# Patient Record
Sex: Female | Born: 1990 | Race: Black or African American | Hispanic: No | Smoking: Former smoker
Health system: Southern US, Community
[De-identification: ages and names within clinical notes are randomized; demographics above are authoritative.]

## PROBLEM LIST (undated history)

## (undated) DIAGNOSIS — J45909 Unspecified asthma, uncomplicated: Secondary | ICD-10-CM

## (undated) HISTORY — PX: DILATION AND CURETTAGE OF UTERUS: SHX78

## (undated) HISTORY — PX: BREAST LUMPECTOMY: SHX2

## (undated) HISTORY — DX: Unspecified asthma, uncomplicated: J45.909

## (undated) HISTORY — PX: TONSILLECTOMY: SUR1361

---

## 2020-09-15 ENCOUNTER — Ambulatory Visit (INDEPENDENT_AMBULATORY_CARE_PROVIDER_SITE_OTHER): Payer: Medicaid Other | Admitting: Obstetrics and Gynecology

## 2020-09-15 ENCOUNTER — Other Ambulatory Visit: Payer: Self-pay

## 2020-09-15 DIAGNOSIS — Z3A09 9 weeks gestation of pregnancy: Secondary | ICD-10-CM | POA: Insufficient documentation

## 2020-09-15 DIAGNOSIS — Z3201 Encounter for pregnancy test, result positive: Secondary | ICD-10-CM | POA: Diagnosis not present

## 2020-09-15 LAB — POCT PREGNANCY, URINE: Preg Test, Ur: POSITIVE — AB

## 2020-09-15 NOTE — Patient Instructions (Signed)
Prenatal Care Providers           [facility]  [address] [phone]  [facility]   [address]  [phone]  [facility] @ [place]       [address] [phone]            [facility] @ [place]     [address] [phone]          [facility] @ [place]   [address] [phone]  [facility]  [address] [phone]     [facility])  [address]   [phone]     [facility]  Phone: [phone]  [facility]  Phone: [phone]  [facility] Phone: [phone]  [facility] Phone: [phone]  [facility] Phone: [phone]  [facility] Phone: [phone]  [facility]  Phone: [phone]  

## 2020-09-15 NOTE — Progress Notes (Signed)
Here for nurse visit for pregnancy test which was positive. Her LMP 07/10/20 this makes her [redacted]w[redacted]d with EDD 04/16/21. She states she had high risk with previous child due to short cervix. She has not been seen before in our practice Dr. Donavan Foil in to meet [facility]. Advised to start prenatal care asap and to start prenatal vitamins. List of providers placed in AVS. Brandi Penley,RN

## 2020-10-09 ENCOUNTER — Encounter: Payer: Self-pay | Admitting: Obstetrics and Gynecology

## 2020-10-09 ENCOUNTER — Other Ambulatory Visit (HOSPITAL_COMMUNITY)
Admission: RE | Admit: 2020-10-09 | Discharge: 2020-10-09 | Disposition: A | Discharge status: Home / Self Care | Payer: Medicaid Other | Source: Ambulatory Visit | Attending: Obstetrics and Gynecology | Admitting: Obstetrics and Gynecology

## 2020-10-09 ENCOUNTER — Other Ambulatory Visit: Payer: Self-pay

## 2020-10-09 ENCOUNTER — Ambulatory Visit (INDEPENDENT_AMBULATORY_CARE_PROVIDER_SITE_OTHER): Payer: Medicaid Other | Admitting: Obstetrics and Gynecology

## 2020-10-09 DIAGNOSIS — O09291 Supervision of pregnancy with other poor reproductive or obstetric history, first trimester: Secondary | ICD-10-CM

## 2020-10-09 DIAGNOSIS — O0990 Supervision of high risk pregnancy, unspecified, unspecified trimester: Secondary | ICD-10-CM | POA: Insufficient documentation

## 2020-10-09 DIAGNOSIS — O09292 Supervision of pregnancy with other poor reproductive or obstetric history, second trimester: Secondary | ICD-10-CM | POA: Insufficient documentation

## 2020-10-09 DIAGNOSIS — Z3A13 13 weeks gestation of pregnancy: Secondary | ICD-10-CM | POA: Insufficient documentation

## 2020-10-09 MED ORDER — BLOOD PRESSURE KIT DEVI: 1.0000 | 0 refills | Status: DC | PRN

## 2020-10-09 NOTE — Progress Notes (Signed)
INITIAL PRENATAL VISIT NOTE  Subjective:  Brandi Rhodes is a 30 y.o. [id number] at 66w0dby LMP being seen today for her initial prenatal visit. She has an obstetric history significant for hx of shortened cervix, but has never had cerclage or progesterone injections.  She has had no surgery to the cervix.. She has a medical history significant for nothing.  Patient reports no complaints.  Contractions: Not present. Vag. Bleeding: None.   . Denies leaking of fluid.    History reviewed. No pertinent past medical history.  Past Surgical History:  Procedure Laterality Date   DILATION AND CURETTAGE OF UTERUS     TONSILLECTOMY      OB History  Gravida Para Term Preterm AB Living  5 2 2  0 2 2  SAB IAB Ectopic Multiple Live Births  2 0 0 0 2    # Outcome Date GA Lbr Len/2nd Weight Sex Delivery Anes PTL Lv  5 Current           4 Term 06/14/16    M Vag-Spont EPI  LIV  3 Term 07/05/14    F Vag-Spont EPI N LIV  2 SAB           1 SAB             Social History   Socioeconomic History   Marital status: Single    Spouse name: Not on file   Number of children: Not on file   Years of education: Not on file   Highest education level: Not on file  Occupational History   Not on file  Tobacco Use   Smoking status: Former    Types: Cigarettes   Smokeless tobacco: Never  Vaping Use   Vaping Use: Former  Substance and Sexual Activity   Alcohol use: Not Currently   Drug use: Not Currently    Types: Marijuana   Sexual activity: Yes  Other Topics Concern   Not on file  Social History Narrative   Not on file   Social Determinants of Health   Financial Resource Strain: Not on file  Food Insecurity: Not on file  Transportation Needs: Not on file  Physical Activity: Not on file  Stress: Not on file  Social Connections: Not on file    History reviewed. No pertinent family history.   Current Outpatient Medications:    Blood Pressure Monitoring (BLOOD PRESSURE KIT) DEVI, 1  Device by Does not apply route as needed., Disp: 1 each, Rfl: 0   Prenatal Vit-Fe Fumarate-FA (PRENATAL PO), Take by mouth., Disp: , Rfl:   No Known Allergies  Review of Systems: Negative except for what is mentioned in HPI.  Objective:   Vitals:   10/09/20 1117  BP: 138/69  Pulse: 62  Weight: 220 lb (99.8 kg)    Fetal Status: Fetal Heart Rate (bpm): 177         Physical Exam: BP 138/69   Pulse 62   Wt 220 lb (99.8 kg)   LMP 07/10/2020 (Exact Date)   BMI 31.57 kg/m  CONSTITUTIONAL: Well-developed, well-nourished female in no acute distress.  NEUROLOGIC: Alert and oriented to person, place, and time. Normal reflexes, muscle tone coordination. No cranial nerve deficit noted. PSYCHIATRIC: Normal mood and affect. Normal behavior. Normal judgment and thought content. SKIN: Skin is warm and dry. No rash noted. Not diaphoretic. No erythema. No pallor. HENT:  Normocephalic, atraumatic, External right and left ear normal. Oropharynx is clear and moist EYES: Conjunctivae and EOM are normal.  NECK: Normal range of motion, supple, no masses CARDIOVASCULAR: Normal heart rate noted, regular rhythm RESPIRATORY: Effort and breath sounds normal, no problems with respiration noted BREASTS: deferred ABDOMEN: Soft, nontender, nondistended, gravid. GU: normal appearing external female genitalia, multiparous normal appearing cervix, scant white discharge in vagina, no lesions noted, pap taken without incident, chaperone present Bimanual: 12 weeks sized uterus, no adnexal tenderness or palpable lesions noted MUSCULOSKELETAL: Normal range of motion. EXT:  No edema and no tenderness. 2+ distal pulses.   Assessment and Plan:  Pregnancy: [id number] at 46w0dby LMP  1. Supervision of high risk pregnancy, antepartum Continue routine care - Cytology - PAP( [place]) - [place] DET[place] +14 WK; Future - Genetic Screening - Hemoglobin A1c - Culture, OB Urine - CBC/D/Plt+RPR+Rh+ABO+RubIgG... - CHL  AMB BABYSCRIPTS SCHEDULE OPTIMIZATION - [place] OB TRANSVAGINAL; Future - Cervicovaginal ancillary only( )  2. [redacted] weeks gestation of pregnancy   3. History of prior pregnancy with short cervix, currently pregnant in first trimester Transvaginal ultrasound ordered to eval possible short cervix   Preterm labor symptoms and general obstetric precautions including but not limited to vaginal bleeding, contractions, leaking of fluid and fetal movement were reviewed in detail with the patient.  Please refer to After Visit Summary for other counseling recommendations.   Return in about 4 weeks (around 11/06/2020) for [facility], in person.  LGriffin Basil9/02/2020 2:01 PM

## 2020-10-10 LAB — CERVICOVAGINAL ANCILLARY ONLY
Bacterial Vaginitis (gardnerella): POSITIVE — AB
Candida Glabrata: NEGATIVE
Candida Vaginitis: NEGATIVE
Chlamydia: NEGATIVE
Comment: NEGATIVE
Comment: NEGATIVE
Comment: NEGATIVE
Comment: NEGATIVE
Comment: NEGATIVE
Comment: NORMAL
Neisseria Gonorrhea: NEGATIVE
Trichomonas: NEGATIVE

## 2020-10-10 LAB — CBC/D/PLT+RPR+RH+ABO+RUBIGG...
Antibody Screen: NEGATIVE
Basophils Absolute: 0 10*3/uL (ref 0.0–0.2)
Basos: 0 %
EOS (ABSOLUTE): 0.1 10*3/uL (ref 0.0–0.4)
Eos: 1 %
HCV Ab: <0.1 s/co ratio (ref 0.0–0.9)
HIV Screen 4th Generation wRfx: NONREACTIVE
Hematocrit: 41.4 % (ref 34.0–46.6)
Hemoglobin: 12.7 g/dL (ref 11.1–15.9)
Hepatitis B Surface Ag: NEGATIVE
Immature Grans (Abs): 0 10*3/uL (ref 0.0–0.1)
Immature Granulocytes: 0 %
Lymphocytes Absolute: 2.2 10*3/uL (ref 0.7–3.1)
Lymphs: 30 %
MCH: 22.8 pg — ABNORMAL LOW (ref 26.6–33.0)
MCHC: 30.7 g/dL — ABNORMAL LOW (ref 31.5–35.7)
MCV: 74 fL — ABNORMAL LOW (ref 79–97)
Monocytes Absolute: 0.4 10*3/uL (ref 0.1–0.9)
Monocytes: 6 %
Neutrophils Absolute: 4.5 10*3/uL (ref 1.4–7.0)
Neutrophils: 63 %
Platelets: 310 10*3/uL (ref 150–450)
RBC: 5.57 x10E6/uL — ABNORMAL HIGH (ref 3.77–5.28)
RDW: 22 % — ABNORMAL HIGH (ref 11.7–15.4)
RPR Ser Ql: NONREACTIVE
Rh Factor: POSITIVE
Rubella Antibodies, IGG: 2.99 index (ref >0.99)
WBC: 7.3 10*3/uL (ref 3.4–10.8)

## 2020-10-10 LAB — CYTOLOGY - PAP
Comment: NEGATIVE
Diagnosis: NEGATIVE
High risk HPV: NEGATIVE

## 2020-10-10 LAB — HEMOGLOBIN A1C
Est. average glucose Bld gHb Est-mCnc: 111 mg/dL
Hgb A1c MFr Bld: 5.5 % (ref 4.8–5.6)

## 2020-10-10 LAB — HCV INTERPRETATION

## 2020-10-11 LAB — CULTURE, OB URINE

## 2020-10-11 LAB — URINE CULTURE, OB REFLEX

## 2020-10-27 ENCOUNTER — Encounter: Payer: Self-pay | Admitting: General Practice

## 2020-10-30 ENCOUNTER — Telehealth: Payer: Self-pay | Admitting: General Practice

## 2020-10-30 ENCOUNTER — Encounter: Payer: Self-pay | Admitting: General Practice

## 2020-10-30 NOTE — Telephone Encounter (Signed)
Patient left message on nurse voicemail line stating she is returning our phone call. Called patient and reviewed horizon test results with her. Offered [employer]'s phone number to set up genetic information session. Patient verbalized understanding.

## 2020-10-30 NOTE — Telephone Encounter (Signed)
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 Called patient, no answer- left message to call us back for results.

## 2020-11-03 ENCOUNTER — Ambulatory Visit: Payer: Medicaid Other

## 2020-11-06 ENCOUNTER — Encounter: Payer: Medicaid Other | Admitting: Obstetrics and Gynecology

## 2020-11-10 ENCOUNTER — Other Ambulatory Visit: Payer: Self-pay | Admitting: Obstetrics and Gynecology

## 2020-11-10 ENCOUNTER — Ambulatory Visit: Payer: Medicaid Other | Attending: Obstetrics and Gynecology

## 2020-11-10 ENCOUNTER — Other Ambulatory Visit: Payer: Self-pay

## 2020-11-10 ENCOUNTER — Ambulatory Visit: Payer: Medicaid Other | Admitting: *Deleted

## 2020-11-10 ENCOUNTER — Encounter: Payer: Self-pay | Admitting: *Deleted

## 2020-11-10 ENCOUNTER — Ambulatory Visit: Payer: Medicaid Other | Attending: Obstetrics | Admitting: Obstetrics

## 2020-11-10 VITALS — BP 138/75 | HR 72

## 2020-11-10 DIAGNOSIS — O0990 Supervision of high risk pregnancy, unspecified, unspecified trimester: Secondary | ICD-10-CM | POA: Diagnosis present

## 2020-11-10 DIAGNOSIS — N883 Incompetence of cervix uteri: Secondary | ICD-10-CM | POA: Insufficient documentation

## 2020-11-10 DIAGNOSIS — O26872 Cervical shortening, second trimester: Secondary | ICD-10-CM | POA: Diagnosis not present

## 2020-11-10 DIAGNOSIS — Z3A17 17 weeks gestation of pregnancy: Secondary | ICD-10-CM

## 2020-11-10 DIAGNOSIS — O99212 Obesity complicating pregnancy, second trimester: Secondary | ICD-10-CM | POA: Diagnosis not present

## 2020-11-10 DIAGNOSIS — O09292 Supervision of pregnancy with other poor reproductive or obstetric history, second trimester: Secondary | ICD-10-CM

## 2020-11-11 NOTE — Progress Notes (Signed)
MFM Note  Brandi Rhodes was seen for a limited ultrasound and transvaginal cervical length measurement as she has a history of cervical shortening during one of her prior pregnancies.    Her pregnancy history is as follows:   First pregnancy resulted in a fetal demise at around 13+ weeks requiring a D&C.    In her second pregnancy, cervical shortening was noted at between 16 to 18 weeks.  She reports that her cervical length was less than 1 cm long at that time.  She subsequently delivered at 37 weeks.    Her third pregnancy was a uncomplicated full-term delivery.  Her cervical lengths in that pregnancy were all within normal limits.    She denies any prior cervical surgeries.    On today's exam, a viable singleton intrauterine gestation was noted with normal amniotic fluid.    A transvaginal ultrasound performed today shows a cervical length of 4.2 cm long without any signs of funneling.    A complete placenta previa was noted today.  The patient denies any vaginal bleeding.    Due to placenta previa, pelvic rest was advised. She was advised that most cases of placenta previa will resolve later in pregnancy.   As the patient does not have a history of a prior preterm birth and based on the normal cervical length measurement obtained today, there are no indications for treatment with progesterone or a cerclage at this time.  We will continue to follow her with serial cervical length measurements.    She will return in 1 week for a detailed fetal anatomy scan.  A total of 30 minutes was spent counseling and coordinating the care for this patient.  Greater than 50% of the time was spent in direct face-to-face contact.

## 2020-11-13 ENCOUNTER — Encounter: Payer: Self-pay | Admitting: *Deleted

## 2020-11-13 ENCOUNTER — Other Ambulatory Visit: Payer: Self-pay

## 2020-11-13 ENCOUNTER — Ambulatory Visit: Payer: Medicaid Other | Attending: Obstetrics and Gynecology

## 2020-11-13 ENCOUNTER — Other Ambulatory Visit: Payer: Self-pay | Admitting: Obstetrics and Gynecology

## 2020-11-13 ENCOUNTER — Ambulatory Visit: Payer: Medicaid Other | Admitting: *Deleted

## 2020-11-13 ENCOUNTER — Other Ambulatory Visit: Payer: Self-pay | Admitting: *Deleted

## 2020-11-13 VITALS — BP 134/63 | HR 71

## 2020-11-13 DIAGNOSIS — O0990 Supervision of high risk pregnancy, unspecified, unspecified trimester: Secondary | ICD-10-CM

## 2020-11-13 DIAGNOSIS — Z363 Encounter for antenatal screening for malformations: Secondary | ICD-10-CM

## 2020-11-17 ENCOUNTER — Other Ambulatory Visit: Payer: Self-pay | Admitting: *Deleted

## 2020-12-11 ENCOUNTER — Ambulatory Visit: Payer: Medicaid Other

## 2020-12-11 ENCOUNTER — Ambulatory Visit: Payer: Medicaid Other | Attending: Obstetrics and Gynecology

## 2021-01-13 ENCOUNTER — Other Ambulatory Visit: Payer: Self-pay

## 2021-01-13 ENCOUNTER — Ambulatory Visit (HOSPITAL_BASED_OUTPATIENT_CLINIC_OR_DEPARTMENT_OTHER): Payer: Medicaid Other

## 2021-01-13 ENCOUNTER — Ambulatory Visit: Payer: Medicaid Other | Attending: Obstetrics and Gynecology | Admitting: *Deleted

## 2021-01-13 ENCOUNTER — Other Ambulatory Visit: Payer: Self-pay | Admitting: *Deleted

## 2021-01-13 ENCOUNTER — Encounter: Payer: Self-pay | Admitting: *Deleted

## 2021-01-13 VITALS — BP 134/73 | HR 92

## 2021-01-13 DIAGNOSIS — O09292 Supervision of pregnancy with other poor reproductive or obstetric history, second trimester: Secondary | ICD-10-CM | POA: Diagnosis not present

## 2021-01-13 DIAGNOSIS — Z363 Encounter for antenatal screening for malformations: Secondary | ICD-10-CM | POA: Insufficient documentation

## 2021-01-13 DIAGNOSIS — Z148 Genetic carrier of other disease: Secondary | ICD-10-CM | POA: Insufficient documentation

## 2021-01-13 DIAGNOSIS — Z3689 Encounter for other specified antenatal screening: Secondary | ICD-10-CM

## 2021-01-13 DIAGNOSIS — O99322 Drug use complicating pregnancy, second trimester: Secondary | ICD-10-CM | POA: Diagnosis not present

## 2021-01-13 DIAGNOSIS — E669 Obesity, unspecified: Secondary | ICD-10-CM | POA: Diagnosis not present

## 2021-01-13 DIAGNOSIS — O99212 Obesity complicating pregnancy, second trimester: Secondary | ICD-10-CM | POA: Insufficient documentation

## 2021-01-13 DIAGNOSIS — Z3A24 24 weeks gestation of pregnancy: Secondary | ICD-10-CM | POA: Diagnosis not present

## 2021-01-13 DIAGNOSIS — F1290 Cannabis use, unspecified, uncomplicated: Secondary | ICD-10-CM | POA: Insufficient documentation

## 2021-01-13 DIAGNOSIS — O0990 Supervision of high risk pregnancy, unspecified, unspecified trimester: Secondary | ICD-10-CM

## 2021-01-28 ENCOUNTER — Other Ambulatory Visit (HOSPITAL_COMMUNITY)
Admission: RE | Admit: 2021-01-28 | Discharge: 2021-01-28 | Disposition: A | Discharge status: Home / Self Care | Payer: Medicaid Other | Source: Ambulatory Visit | Attending: Obstetrics and Gynecology | Admitting: Obstetrics and Gynecology

## 2021-01-28 ENCOUNTER — Ambulatory Visit (INDEPENDENT_AMBULATORY_CARE_PROVIDER_SITE_OTHER): Payer: Medicaid Other | Admitting: Obstetrics and Gynecology

## 2021-01-28 ENCOUNTER — Other Ambulatory Visit: Payer: Self-pay

## 2021-01-28 VITALS — BP 135/69 | HR 83 | Wt 238.1 lb

## 2021-01-28 DIAGNOSIS — N898 Other specified noninflammatory disorders of vagina: Secondary | ICD-10-CM | POA: Insufficient documentation

## 2021-01-28 DIAGNOSIS — O99612 Diseases of the digestive system complicating pregnancy, second trimester: Secondary | ICD-10-CM

## 2021-01-28 DIAGNOSIS — O0990 Supervision of high risk pregnancy, unspecified, unspecified trimester: Secondary | ICD-10-CM

## 2021-01-28 DIAGNOSIS — Z3A26 26 weeks gestation of pregnancy: Secondary | ICD-10-CM | POA: Insufficient documentation

## 2021-01-28 DIAGNOSIS — O26892 Other specified pregnancy related conditions, second trimester: Secondary | ICD-10-CM

## 2021-01-28 DIAGNOSIS — K219 Gastro-esophageal reflux disease without esophagitis: Secondary | ICD-10-CM

## 2021-01-28 MED ORDER — FAMOTIDINE 20 MG PO TABS: 20.0000 mg | ORAL_TABLET | Freq: Two times a day (BID) | ORAL | 1 refills | Status: DC

## 2021-01-28 NOTE — Progress Notes (Signed)
° °  PRENATAL VISIT NOTE  Subjective:  Brandi Rhodes is a 30 y.o. [id number] at [redacted]w[redacted]d being seen today for ongoing prenatal care.  She is currently monitored for the following issues for this high-risk pregnancy and has [redacted] weeks gestation of pregnancy; Supervision of high risk pregnancy, antepartum; [redacted] weeks gestation of pregnancy; History of prior pregnancy with short cervix, currently pregnant in second trimester; [redacted] weeks gestation of pregnancy; Vaginal discharge during pregnancy in second trimester; and Gastroesophageal reflux during pregnancy in second trimester, antepartum on their problem list.  Patient doing well with no acute concerns today. She reports  vaginal discharge with odor .  Contractions: Not present. Vag. Bleeding: None.  Movement: Present. Denies leaking of fluid.   The following portions of the patient's history were reviewed and updated as appropriate: allergies, current medications, past family history, past medical history, past social history, past surgical history and problem list. Problem list updated.  Objective:   Vitals:   01/28/21 1638 01/28/21 1719  BP: (!) 142/76 135/69  Pulse: 79 83  Weight: 238 lb 1.6 oz (108 kg)     Fetal Status: Fetal Heart Rate (bpm): 145 Fundal Height: 26 cm Movement: Present     General:  Alert, oriented and cooperative. Patient is in no acute distress.  Skin: Skin is warm and dry. No rash noted.   Cardiovascular: Normal heart rate noted  Respiratory: Normal respiratory effort, no problems with respiration noted  Abdomen: Soft, gravid, appropriate for gestational age.  Pain/Pressure: Absent     Pelvic: Cervical exam deferred        Extremities: Normal range of motion.     Mental Status:  Normal mood and affect. Normal behavior. Normal judgment and thought content.   Assessment and Plan:  Pregnancy: [id number] at [redacted]w[redacted]d  1. Vaginal discharge Self swab performed, results pending - Cervicovaginal ancillary only( [place])  2. [redacted]  weeks gestation of pregnancy   3. Supervision of high risk pregnancy, antepartum Continue routine prenatal care, monitor BP  4. Vaginal discharge during pregnancy in second trimester   5. Gastroesophageal reflux during pregnancy in second trimester, antepartum Trial of H2 blocker for relief - famotidine (PEPCID) 20 MG tablet; Take 1 tablet (20 mg total) by mouth 2 (two) times daily.  Dispense: 60 tablet; Refill: 1  Preterm labor symptoms and general obstetric precautions including but not limited to vaginal bleeding, contractions, leaking of fluid and fetal movement were reviewed in detail with the patient.  Please refer to After Visit Summary for other counseling recommendations.   Return in about 2 weeks (around 02/11/2021) for [facility], in person, 3rd trim labs, 2 hr GTT.   Mariel Aloe, MD Faculty Attending Center for [facility]

## 2021-01-29 ENCOUNTER — Telehealth: Payer: Self-pay | Admitting: Lactation Services

## 2021-01-29 DIAGNOSIS — B9689 Other specified bacterial agents as the cause of diseases classified elsewhere: Secondary | ICD-10-CM

## 2021-01-29 LAB — CERVICOVAGINAL ANCILLARY ONLY
Bacterial Vaginitis (gardnerella): POSITIVE — AB
Candida Glabrata: NEGATIVE
Candida Vaginitis: NEGATIVE
Chlamydia: NEGATIVE
Comment: NEGATIVE
Comment: NEGATIVE
Comment: NEGATIVE
Comment: NEGATIVE
Comment: NEGATIVE
Comment: NORMAL
Neisseria Gonorrhea: NEGATIVE
Trichomonas: NEGATIVE

## 2021-01-29 NOTE — Telephone Encounter (Signed)
Called patient with results of Vaginal Swab, She did not answer. LM for her to call the office at her convenience for results.

## 2021-01-29 NOTE — Telephone Encounter (Signed)
-----   Message from Warden Fillers, MD sent at 01/29/2021  5:00 PM EST ----- Bacterial vaginosis noted, offer treatment

## 2021-01-30 MED ORDER — METRONIDAZOLE 500 MG PO TABS: 500.0000 mg | ORAL_TABLET | Freq: Two times a day (BID) | ORAL | 0 refills | Status: DC

## 2021-01-30 NOTE — Addendum Note (Signed)
Addended by: Jill Side on: 01/30/2021 10:18 AM   Modules accepted: Orders

## 2021-01-30 NOTE — Telephone Encounter (Signed)
Spoke w/pt and informed her of test result consistent with BV. Treatment offered and accepted. Rx sent to pharmacy.  Pt voiced understanding of all information.

## 2021-02-08 NOTE — L&D Delivery Note (Signed)
Delivery Note ?At 3:32 AM a viable female was delivered via Vaginal, Spontaneous (Presentation: Left Occiput Anterior).  APGAR: 8, 9; weight  .   ?Placenta status: Spontaneous, Intact.  Cord: 3 vessels with the following complications: None.  Cord pH: NA ? ?Called to attend delivery and arrived in less than a minute; mother had delivered infant with RN. Infant was on mom's chest and crying.  ? ?Cord clamped and cut by visitor.  ? ?After delivery, patient had bright red vaginal bleeding; TXA and methergine were hung. After 15 minutes, patient had clots in lower uterine segment; she was given of cytotec rectally and I performed a lower uterine segment sweep.  ? ?Anesthesia: None ?Episiotomy: None ?Lacerations: None ?Suture Repair:  None ?Est. Blood Loss (mL): 354 ? ?Mom to postpartum.  Baby to Couplet care / Skin to Skin. ? ?Marylene Land ?04/20/2021, 4:41 AM ? ? ? ?

## 2021-02-10 ENCOUNTER — Other Ambulatory Visit: Payer: Self-pay | Admitting: *Deleted

## 2021-02-10 ENCOUNTER — Other Ambulatory Visit: Payer: Self-pay | Admitting: Obstetrics and Gynecology

## 2021-02-10 ENCOUNTER — Ambulatory Visit (HOSPITAL_BASED_OUTPATIENT_CLINIC_OR_DEPARTMENT_OTHER): Payer: Medicaid Other

## 2021-02-10 ENCOUNTER — Ambulatory Visit: Payer: Medicaid Other | Attending: Obstetrics and Gynecology | Admitting: *Deleted

## 2021-02-10 ENCOUNTER — Other Ambulatory Visit: Payer: Self-pay

## 2021-02-10 ENCOUNTER — Encounter: Payer: Self-pay | Admitting: *Deleted

## 2021-02-10 VITALS — BP 135/67 | HR 81

## 2021-02-10 DIAGNOSIS — E669 Obesity, unspecified: Secondary | ICD-10-CM | POA: Diagnosis not present

## 2021-02-10 DIAGNOSIS — O99323 Drug use complicating pregnancy, third trimester: Secondary | ICD-10-CM | POA: Insufficient documentation

## 2021-02-10 DIAGNOSIS — Z3A28 28 weeks gestation of pregnancy: Secondary | ICD-10-CM | POA: Insufficient documentation

## 2021-02-10 DIAGNOSIS — Z3689 Encounter for other specified antenatal screening: Secondary | ICD-10-CM | POA: Diagnosis not present

## 2021-02-10 DIAGNOSIS — O0990 Supervision of high risk pregnancy, unspecified, unspecified trimester: Secondary | ICD-10-CM

## 2021-02-10 DIAGNOSIS — O09293 Supervision of pregnancy with other poor reproductive or obstetric history, third trimester: Secondary | ICD-10-CM | POA: Insufficient documentation

## 2021-02-10 DIAGNOSIS — O99213 Obesity complicating pregnancy, third trimester: Secondary | ICD-10-CM | POA: Insufficient documentation

## 2021-02-10 DIAGNOSIS — Z363 Encounter for antenatal screening for malformations: Secondary | ICD-10-CM | POA: Insufficient documentation

## 2021-02-10 DIAGNOSIS — Z148 Genetic carrier of other disease: Secondary | ICD-10-CM | POA: Insufficient documentation

## 2021-02-10 DIAGNOSIS — F1290 Cannabis use, unspecified, uncomplicated: Secondary | ICD-10-CM | POA: Insufficient documentation

## 2021-02-10 DIAGNOSIS — Z6832 Body mass index (BMI) 32.0-32.9, adult: Secondary | ICD-10-CM

## 2021-02-11 ENCOUNTER — Ambulatory Visit: Payer: Medicaid Other

## 2021-02-13 ENCOUNTER — Other Ambulatory Visit: Payer: Self-pay

## 2021-02-13 DIAGNOSIS — O0990 Supervision of high risk pregnancy, unspecified, unspecified trimester: Secondary | ICD-10-CM

## 2021-02-13 DIAGNOSIS — Z3A26 26 weeks gestation of pregnancy: Secondary | ICD-10-CM

## 2021-02-16 ENCOUNTER — Other Ambulatory Visit: Payer: Medicaid Other

## 2021-02-16 ENCOUNTER — Encounter: Payer: Self-pay | Admitting: Obstetrics and Gynecology

## 2021-02-16 ENCOUNTER — Encounter: Payer: Medicaid Other | Admitting: Obstetrics and Gynecology

## 2021-02-18 NOTE — Progress Notes (Signed)
Patient did not keep her return OB appointment for 02/16/2021. ° °Aryon Nham, Jr MD °Attending °[facility] ([facility] Practice) ° °

## 2021-02-19 NOTE — Progress Notes (Signed)
Patient did not keep her return OB appointment for 02/16/2021.  Cornelia Copa MD Attending Center for [employer] [facility])

## 2021-02-23 DIAGNOSIS — D563 Thalassemia minor: Secondary | ICD-10-CM | POA: Insufficient documentation

## 2021-02-24 ENCOUNTER — Other Ambulatory Visit: Payer: Self-pay

## 2021-02-24 ENCOUNTER — Encounter: Payer: Self-pay | Admitting: Family Medicine

## 2021-02-24 ENCOUNTER — Ambulatory Visit (INDEPENDENT_AMBULATORY_CARE_PROVIDER_SITE_OTHER): Payer: Medicaid Other | Admitting: Family Medicine

## 2021-02-24 ENCOUNTER — Other Ambulatory Visit: Payer: Medicaid Other

## 2021-02-24 VITALS — BP 140/81 | HR 79 | Wt 239.0 lb

## 2021-02-24 DIAGNOSIS — O0990 Supervision of high risk pregnancy, unspecified, unspecified trimester: Secondary | ICD-10-CM | POA: Diagnosis not present

## 2021-02-24 DIAGNOSIS — Z23 Encounter for immunization: Secondary | ICD-10-CM

## 2021-02-24 DIAGNOSIS — Z3A30 30 weeks gestation of pregnancy: Secondary | ICD-10-CM | POA: Diagnosis not present

## 2021-02-24 DIAGNOSIS — R030 Elevated blood-pressure reading, without diagnosis of hypertension: Secondary | ICD-10-CM

## 2021-02-24 DIAGNOSIS — D563 Thalassemia minor: Secondary | ICD-10-CM

## 2021-02-24 NOTE — Progress Notes (Signed)
° °  PRENATAL VISIT NOTE  Subjective:  Brandi Rhodes is a 31 y.o. [id number] at 68w1dbeing seen today for ongoing prenatal care.  She is currently monitored for the following issues for this high-risk pregnancy and has Supervision of high risk pregnancy, antepartum; History of prior pregnancy with short cervix, currently pregnant in second trimester; Gastroesophageal reflux during pregnancy in second trimester, antepartum; and Alpha thalassemia silent carrier on their problem list.  Patient reports no complaints.  Contractions: Not present. Vag. Bleeding: None.  Movement: Present. Denies leaking of fluid, headaches, edema, vision changes, or RUQ pain.   The following portions of the patient's history were reviewed and updated as appropriate: allergies, current medications, past family history, past medical history, past social history, past surgical history and problem list.   Objective:   Vitals:   02/24/21 0821 02/24/21 1125  BP: (!) 150/78 140/81  Pulse: 79   Weight: 239 lb (108.4 kg)     Fetal Status: Fetal Heart Rate (bpm): 147 Fundal Height: 30 cm Movement: Present  General:  Alert, oriented and cooperative. Patient is in no acute distress.  Skin: Skin is warm and dry. No rash noted.   Cardiovascular: Normal heart rate noted, warm and well perfused.   Respiratory: Normal respiratory effort, no problems with respiration noted.  Abdomen: Soft, gravid, appropriate for gestational age.       Pelvic: Cervical exam deferred.  Extremities: Normal range of motion.  Edema: None.  Mental Status: Normal mood and affect. Normal behavior. Normal judgment and thought content.   Assessment and Plan:  Pregnancy: [id number] 379w1d1. Supervision of high risk pregnancy, antepartum 2. [redacted] weeks gestation of pregnancy Progressing well. FH and FHT within normal limits. 28 week labs completed today, will follow up results. Tdap given. Will follow up in 2 weeks.  - Tdap vaccine greater than or equal to  7yo IM  3. Alpha thalassemia silent carrier Declined genetic counseling.   4. Elevated BP without diagnosis of hypertension Denies history of HTN. Initial BP in office elevated. 140/87 on repeat check 15 minutes later. Recommended that she check BP daily and notify office if values are greater than 140/90. Will also obtain baseline CBC, CMP, and UPC today. Return precautions reviewed should BP become significantly elevated or if she develops signs/symptoms of pre-eclampsia. Patient voiced understanding.  - Comp Met (CMET) - Protein / creatinine ratio, urine  Preterm labor symptoms and general obstetric precautions including but not limited to vaginal bleeding, contractions, leaking of fluid and fetal movement were reviewed in detail with the patient.  Please refer to After Visit Summary for other counseling recommendations.   Return in about 2 weeks (around 03/10/2021) for follow up [facility] OB visit.  Future Appointments  Date Time Provider DeHoagland1/30/2023 10:30 AM [facility]1/30/2023 10:45 AM [facility] US6 [facility]US [facility]2/02/2021 10:35 AM [place], TeRona RavensNP W[facility] Ch[facility] DelMD

## 2021-02-25 LAB — CBC
Hematocrit: 40.1 % (ref 34.0–46.6)
Hemoglobin: 13.1 g/dL (ref 11.1–15.9)
MCH: 27.1 pg (ref 26.6–33.0)
MCHC: 32.7 g/dL (ref 31.5–35.7)
MCV: 83 fL (ref 79–97)
Platelets: 301 10*3/uL (ref 150–450)
RBC: 4.84 x10E6/uL (ref 3.77–5.28)
RDW: 14.9 % (ref 11.7–15.4)
WBC: 8.8 10*3/uL (ref 3.4–10.8)

## 2021-02-25 LAB — COMPREHENSIVE METABOLIC PANEL
ALT: 9 IU/L (ref 0–32)
AST: 12 IU/L (ref 0–40)
Albumin/Globulin Ratio: 1.8 (ref 1.2–2.2)
Albumin: 3.8 g/dL — ABNORMAL LOW (ref 3.9–5.0)
Alkaline Phosphatase: 79 IU/L (ref 44–121)
BUN/Creatinine Ratio: 10 (ref 9–23)
BUN: 4 mg/dL — ABNORMAL LOW (ref 6–20)
Bilirubin Total: <0.2 mg/dL (ref 0.0–1.2)
CO2: 18 mmol/L — ABNORMAL LOW (ref 20–29)
Calcium: 9.2 mg/dL (ref 8.7–10.2)
Chloride: 105 mmol/L (ref 96–106)
Creatinine, Ser: 0.42 mg/dL — ABNORMAL LOW (ref 0.57–1.00)
Globulin, Total: 2.1 g/dL (ref 1.5–4.5)
Glucose: 139 mg/dL — ABNORMAL HIGH (ref 70–99)
Potassium: 3.8 mmol/L (ref 3.5–5.2)
Sodium: 139 mmol/L (ref 134–144)
Total Protein: 5.9 g/dL — ABNORMAL LOW (ref 6.0–8.5)
eGFR: 135 mL/min/{1.73_m2} (ref >59)

## 2021-02-25 LAB — RPR: RPR Ser Ql: NONREACTIVE

## 2021-02-25 LAB — GLUCOSE TOLERANCE, 2 HOURS W/ 1HR
Glucose, 1 hour: 137 mg/dL (ref 70–179)
Glucose, 2 hour: 72 mg/dL (ref 70–152)
Glucose, Fasting: 78 mg/dL (ref 70–91)

## 2021-02-25 LAB — ANTIBODY SCREEN: Antibody Screen: NEGATIVE

## 2021-02-25 LAB — PROTEIN / CREATININE RATIO, URINE
Creatinine, Urine: 117.4 mg/dL
Protein, Ur: 10.6 mg/dL
Protein/Creat Ratio: 90 mg/g creat (ref 0–200)

## 2021-02-25 LAB — HIV ANTIBODY (ROUTINE TESTING W REFLEX): HIV Screen 4th Generation wRfx: NONREACTIVE

## 2021-03-09 ENCOUNTER — Other Ambulatory Visit: Payer: Self-pay

## 2021-03-09 ENCOUNTER — Ambulatory Visit: Payer: Medicaid Other | Attending: Obstetrics

## 2021-03-09 ENCOUNTER — Ambulatory Visit: Payer: Medicaid Other | Admitting: *Deleted

## 2021-03-09 ENCOUNTER — Encounter: Payer: Self-pay | Admitting: *Deleted

## 2021-03-09 VITALS — BP 148/61 | HR 72

## 2021-03-09 DIAGNOSIS — E668 Other obesity: Secondary | ICD-10-CM | POA: Diagnosis not present

## 2021-03-09 DIAGNOSIS — O0990 Supervision of high risk pregnancy, unspecified, unspecified trimester: Secondary | ICD-10-CM | POA: Insufficient documentation

## 2021-03-09 DIAGNOSIS — O99213 Obesity complicating pregnancy, third trimester: Secondary | ICD-10-CM | POA: Insufficient documentation

## 2021-03-09 DIAGNOSIS — O09293 Supervision of pregnancy with other poor reproductive or obstetric history, third trimester: Secondary | ICD-10-CM | POA: Insufficient documentation

## 2021-03-09 DIAGNOSIS — Z3A32 32 weeks gestation of pregnancy: Secondary | ICD-10-CM | POA: Insufficient documentation

## 2021-03-09 DIAGNOSIS — Z6832 Body mass index (BMI) 32.0-32.9, adult: Secondary | ICD-10-CM

## 2021-03-11 ENCOUNTER — Other Ambulatory Visit: Payer: Self-pay

## 2021-03-11 ENCOUNTER — Ambulatory Visit (INDEPENDENT_AMBULATORY_CARE_PROVIDER_SITE_OTHER): Payer: Medicaid Other | Admitting: Nurse Practitioner

## 2021-03-11 VITALS — BP 136/74 | HR 97 | Wt 245.8 lb

## 2021-03-11 DIAGNOSIS — O0990 Supervision of high risk pregnancy, unspecified, unspecified trimester: Secondary | ICD-10-CM

## 2021-03-11 DIAGNOSIS — Z6831 Body mass index (BMI) 31.0-31.9, adult: Secondary | ICD-10-CM

## 2021-03-11 DIAGNOSIS — Z3A32 32 weeks gestation of pregnancy: Secondary | ICD-10-CM

## 2021-03-11 DIAGNOSIS — D563 Thalassemia minor: Secondary | ICD-10-CM

## 2021-03-11 NOTE — Progress Notes (Signed)
° ° °  Subjective:  Brandi Rhodes is a 31 y.o. [id number] at [redacted]w[redacted]d being seen today for ongoing prenatal care.  She is currently monitored for the following issues for this high-risk pregnancy and has Supervision of high risk pregnancy, antepartum; History of prior pregnancy with short cervix, currently pregnant in second trimester; Gastroesophageal reflux during pregnancy in second trimester, antepartum; and Alpha thalassemia silent carrier on their problem list.  Patient reports no complaints.  Contractions: Irritability. Vag. Bleeding: None.  Movement: Present. Denies leaking of fluid.   The following portions of the patient's history were reviewed and updated as appropriate: allergies, current medications, past family history, past medical history, past social history, past surgical history and problem list. Problem list updated.  Objective:   Vitals:   03/11/21 1048  BP: 136/74  Pulse: 97  Weight: 245 lb 12.8 oz (111.5 kg)    Fetal Status: Fetal Heart Rate (bpm): 126 Fundal Height: 37 cm Movement: Present     General:  Alert, oriented and cooperative. Patient is in no acute distress.  Skin: Skin is warm and dry. No rash noted.   Cardiovascular: Normal heart rate noted  Respiratory: Normal respiratory effort, no problems with respiration noted  Abdomen: Soft, gravid, appropriate for gestational age. Pain/Pressure: Present     Pelvic:  Cervical exam deferred        Extremities: Normal range of motion.  Edema: None  Mental Status: Normal mood and affect. Normal behavior. Normal judgment and thought content.   Urinalysis:      Assessment and Plan:  Pregnancy: [id number] at [redacted]w[redacted]d  1. Supervision of high risk pregnancy, antepartum BP today is normal Has had elevated pressures and likely has gestational hypertension Is taking BP at home - some are elevated and reviewed technique for accurate BP - has not been doing those things - Denies any headaches or blurred vision.  Has no edema.   Preeclampsia lab results were normal.  2. Alpha thalassemia silent carrier   3. BMI 31.0-31.9,adult May be causing her fundal height to be elevated  4. [redacted] weeks gestation of pregnancy   Preterm labor symptoms and general obstetric precautions including but not limited to vaginal bleeding, contractions, leaking of fluid and fetal movement were reviewed in detail with the patient. Please refer to After Visit Summary for other counseling recommendations.  Return in about 2 weeks (around 03/25/2021) for [facility].  Earlie Server, RN, MSN, NP-BC Nurse Practitioner, [facility] for [employer] 03/11/2021 1:27 PM

## 2021-03-11 NOTE — Patient Instructions (Signed)
BRAINSTORMING  Develop a Plan Goals: Provide a way to start conversation about your new life with a baby Assist parents in recognizing and using resources within their reach Help pave the way before birth for an easier period of transition afterwards.  Make a list of the following information to keep in a central location: Full name of Mom and Partner: _____________________________________________ 41 full name and Date of Birth: ___________________________________________ Home Address: ___________________________________________________________ ________________________________________________________________________ Home Phone: ____________________________________________________________ Parents' cell numbers: _____________________________________________________ ________________________________________________________________________ Name and contact info for OB: ______________________________________________ Name and contact info for Pediatrician:________________________________________ Contact info for Lactation Consultants: ________________________________________  REST and SLEEP *You each need at least 4-5 hours of uninterrupted sleep every day. Write specific names and contact information.* How are you going to rest in the postpartum period? While partner's home? When partner returns to work? When you both return to work? Where will your baby sleep? Who is available to help during the day? Evening? Night? Who could move in for a period to help support you? What are some ideas to help you get enough sleep? __________________________________________________________________________________________________________________________________________________________________________________________________________________________________________ NUTRITIOUS FOOD AND DRINK *Plan for meals before your baby is born so you can have healthy food to eat during the immediate postpartum  period.* Who will look after breakfast? Lunch? Dinner? List names and contact information. Brainstorm quick, healthy ideas for each meal. What can you do before baby is born to prepare meals for the postpartum period? How can others help you with meals? Which grocery stores provide online shopping and delivery? Which restaurants offer take-out or delivery options? ______________________________________________________________________________________________________________________________________________________________________________________________________________________________________________________________________________________________________________________________________________________________________________________________________  CARE FOR MOM *It's important that mom is cared for and pampered in the postpartum period. Remember, the most important ways new mothers need care are: sleep, nutrition, gentle exercise, and time off.* Who can come take care of mom during this period? Make a list of people with their contact information. List some activities that make you feel cared for, rested, and energized? Who can make sure you have opportunities to do these things? Does mom have a space of her very own within your home that's just for her? Make a [facility] where she can be comfortable, rest, and renew herself daily. ______________________________________________________________________________________________________________________________________________________________________________________________________________________________________________________________________________________________________________________________________________________________________________________________________    CARE FOR AND FEEDING BABY *Knowledgeable and encouraging people will offer the best support with regard to feeding your baby.* Educate yourself and choose the best feeding option  for your baby. Make a list of people who will guide, support, and be a resource for you as your care for and feed your baby. (Friends that have breastfed or are currently breastfeeding, lactation consultants, breastfeeding support groups, etc.) Consider a postpartum doula. (These websites can give you information: dona.org & BuyingShow.es) Seek out local breastfeeding resources like the breastfeeding support group at [employer] or [employer]. ______________________________________________________________________________________________________________________________________________________________________________________________________________________________________________________________________________________________________________________________________________________________________________________________________  Verner Chol [employer] ERR[employer] Who can help with a thorough cleaning before baby is born? Make a list of people who will help with housekeeping and chores, like laundry, light cleaning, dishes, bathrooms, etc. Who can run some errands for you? What can you do to make sure you are stocked with basic supplies before baby is born? Who is going to do the shopping? ______________________________________________________________________________________________________________________________________________________________________________________________________________________________________________________________________________________________________________________________________________________________________________________________________     Family Adjustment *Nurture yourselvesit helps parents be more loving and allows for better bonding with their child.* What sorts of things do you and partner enjoy doing together? Which activities help you to connect and strengthen your relationship? Make a list of those things. Make a list of people whom  trust to care for your baby so you  can have some time together as a couple. °What types of things help partner feel connected to Mom? Make a list. °What needs will partner have in order to bond with baby? °Other children? Who will care for them when you go into labor and while you are in the hospital? °Think about what the needs of your older children might be. Who can help you meet those needs? In what ways are you helping them prepare for bringing baby home? List some specific strategies you have for family adjustment. °_______________________________________________________________________________________________________________________________________________________________________________________________________________________________________________________________________________________________________________________________________________ ° °SUPPORT °*Someone who can empathize with experiences normalizes your problems and makes them more bearable.* °Make a list of other friends, neighbors, and/or co-workers you know with infants (and small children, if applicable) with whom you can connect. °Make a list of local or online support groups, mom groups, etc. in which you can be involved. °______________________________________________________________________________________________________________________________________________________________________________________________________________________________________________________________________________________________________________________________________________________________________________________________________ ° °Childcare Plans °Investigate and plan for childcare if mom is returning to work. °Talk about mom's concerns about her transition back to work. °Talk about partner's concerns regarding this transition. ° °Mental Health °*Your mental health is one of the highest priorities for a pregnant or postpartum mom.* °1 in 5 women experience anxiety and/or depression from the time  of conception through the first year after birth. °Postpartum Mood Disorders are the #1 complication of pregnancy and childbirth and the suffering experienced by these mothers is not necessary! These illnesses are temporary and respond well to treatment, which often includes self-care, social support, talk therapy, and medication when needed. °Women experiencing anxiety and depression often say things like: “I'm supposed to be happy…why do I feel so sad?”, “Why can't I snap out of it?”, “I'm having thoughts that scare me.” °There is no need to be embarrassed if you are feeling these symptoms: °Overwhelmed, anxious, angry, sad, guilty, irritable, hopeless, exhausted but can't sleep °You are NOT alone. You are NOT to blame. With help, you WILL be well. °Where can I find help? Medical professionals such as your OB, midwife, gynecologist, family practitioner, primary care provider, pediatrician, or mental health providers; [facility] support groups: Feelings After Birth, Breastfeeding Support Group, Baby and Me Group, and Fit 4 Two exercise classes. °You have permission to ask for help. It will confirm your feelings, validate your experiences, share/learn coping strategies, and gain support and encouragement as you heal. You are important! °BRAINSTORM °Make a list of local resources, including resources for mom and for partner. °Identify support groups. °Identify people to call late at night - include names and contact info. °Talk with partner about perinatal mood and anxiety disorders. °Talk with your OB, midwife, and doula about baby blues and about perinatal mood and anxiety disorders. °Talk with your pediatrician about perinatal mood and anxiety disorders. ° ° °Support & Sanity Savers   °What do you really need? ° °Basics °In preparing for a new baby, many expectant parents spend hours shopping for baby clothes, decorating the nursery, and deciding which car seat to buy. Yet most don't think much about what  the reality of parenting a newborn will be like, and what they need to make it through that. So, here is the advice of experienced parents. We know you'll read this, and think “they're exaggerating, I don't really need that.” Just trust us on these, OK? Plan for all of this, and if it turns out you don't need it, come back and teach us how you did it! ° °Must-Haves (Once baby's survival   needs are met, make sure you attend to your own survival needs!) °Sleep °An average newborn sleeps 16-18 hours per day, over 6-7 sleep periods, rarely more than three hours at a time. It is normal and healthy for a newborn to wake throughout the night... but really hard on parents!! °Naps. Prioritize sleep above any responsibilities like: cleaning house, visiting friends, running errands, etc.  Sleep whenever baby sleeps. If you can't nap, at least have restful times when baby eats. The more rest you get, the more patient you will be, the more emotionally stable, and better at solving problems. ° °Food °You may not have realized it would be difficult to eat when you have a newborn. Yet, when we talk to °countless new parents, they say things like “it may be 2:00 pm when I realize I haven't had breakfast yet.” Or “every time we sit down to dinner, baby needs to eat, and my food gets cold, so I don't bother to eat it.” °Finger food. Before your baby is born, stock up with one months' worth of food that: 1) you can eat with one hand while holding a baby, 2) doesn't need to be prepped, 3) is good hot or cold, 4) doesn't spoil when left out for a few hours, and 5) you like to eat. Think about: nuts, dried fruit, Clif bars, pretzels, jerky, gogurt, baby carrots, apples, bananas, crackers, cheez-n-crackers, string cheese, hot pockets or frozen burritos to microwave, garden burgers and breakfast pastries to put in the toaster, yogurt drinks, etc. °Restaurant Menus. Make lists of your favorite restaurants & menu items. When family/friends  want to help, you can give specific information without much thought. They can either bring you the food or send gift cards for just the right meals. °Freezer Meals.  Take some time to make a few meals to put in the freezer ahead of time.  Easy to freeze meals can be anything such as soup, lasagna, chicken pie, or spaghetti sauce. °Set up a Meal Schedule.  Ask friends and family to sign up to bring you meals during the first few weeks of being home. (It can be passed around at baby showers!) You have no idea how helpful this will be until you are in the throes of parenting.  www.takethemameal.com is a great website to check out. °Emotional Support °Know who to call when you're stressed out. Parenting a newborn is very challenging work. There are times when it totally overwhelms your normal coping abilities. EVERY NEW PARENT NEEDS TO HAVE A PLAN FOR WHO TO CALL WHEN THEY JUST CAN'T COPE ANY MORE. (And it has to be someone other than the baby's other parent!) Before your baby is born, come up with at least one person you can call for support - write their phone number down and post it on the refrigerator. °Anxiety & Sadness. Baby blues are normal after pregnancy; however, there are more severe types of anxiety & sadness which can occur and should not be ignored.  They are always treatable, but you have to take the first step by reaching out for help. [facility] offers a “Mom Talk” group which meets every Tuesday from 10 am - 11 am.  This group is for new moms who need support and connection after their babies are born.  Call [phone].  °Really, Really Helpful (Plan for them! Make sure these happen often!!) °Physical Support with Taking Care of Yourselves °Asking friends and family. Before your baby is born, set up a schedule   schedule of people who can come and visit and help out (or ask a friend to schedule for you). Any time someone says let me know what I can do to help, sign them up for a day. When they get  there, their job is not to take care of the baby (that's your job and your joy). Their job is to take care of you!  Postpartum doulas. If you don't have anyone you can call on for support, look into postpartum doulas:  professionals at helping parents with caring for baby, caring for themselves, getting breastfeeding started, and helping with household tasks. www.padanc.org is a helpful website for learning about doulas in our area. Peer Support / Parent Groups Why: One of the greatest ideas for new parents is to be around other new parents. Parent groups give you a chance to share and listen to others who are going through the same season of life, get a sense of what is normal infant development by watching several babies learn and grow, share your stories of triumph and struggles with empathetic ears, and forgive your own mistakes when you realize all parents are learning by trial and error. Where to find: There are many places you can meet other new parents throughout our community.  [facility] offers the following classes for new moms and their little ones:  Baby and Me (Birth to [place]) and Breastfeeding Support Group. Go to www.conehealthybaby.com or call [phone] for more information. Time for your Relationship It's easy to get so caught up in meeting baby's immediate needs that it's hard to find time to connect with your partner, and meet the needs of your relationship. It's also easy to forget what quality time with your partner actually looks like. If you take your baby on a date, you'd be amazed how much of your couple time is spent feeding the baby, diapering the baby, admiring the baby, and talking about the baby. Dating: Try to take time for just the two of you. Babysitter tip: Sometimes when moms are breastfeeding a newborn, they find it hard to figure out how to schedule outings around baby's unpredictable feeding schedules. Have the babysitter come for a three hour period. When  she comes over, if baby has just eaten, you can leave right away, and come back in two hours. If baby hasn't fed recently, you start the date at home. Once baby gets hungry and gets a good feeding in, you can head out for the rest of your date time. Date Nights at Home: If you can't get out, at least set aside one evening a week to prioritize your relationship: whenever baby dozes off or doesn't have any immediate needs, spend a little time focusing on each other. Potential conflicts: The main relationship conflicts that come up for new parents are: issues related to sexuality, financial stresses, a feeling of an unfair division of household tasks, and conflicts in parenting styles. The more you can work on these issues before baby arrives, the better!  Fun and Frills (Don't forget these and don't feel guilty for indulging in them!) Everyone has something in life that is a fun little treat that they do just for themselves. It may be: reading the morning paper, or going for a daily jog, or having coffee with a friend once a week, or going to a movie on Friday nights, or fine chocolates, or bubble baths, or curling up with a good book. Unless you do fun things for yourself every now and  it's hard to have the energy for fun with your baby. Whatever your “special” treats are, make sure you find a way to continue to indulge in them after your baby is born. These special moments can recharge you, and allow you to return to baby with a new joy ° ° °PERINATAL MOOD DISORDERS: MATERNAL MENTAL HEALTH FROM CONCEPTION THROUGH THE POSTPARTUM PERIOD ° ° °_________________________________________Emergency and Crisis Resources °If you are an imminent risk to self or others, are experiencing intense personal distress, and/or have noticed significant changes in activities of daily living, call:  °911 °[facility]: [phone] ° [address], [place], [place], [address] °Mobile Crisis:  877-626-1772 °National Suicide Hotline: 988 °Or visit the following crisis centers: °Local Emergency Departments °[facility]: [address], [place]  °336-676-6840. Hours: 8:30AM-5PM. Insurance Accepted: Medicaid, Medicare, and Uninsured.  °[facility]:  [address], [place]  °Mon-Friday 8am-3pm, [phone]   ° °                                                                              ___________ Non-Crisis Resources °To identify specific providers that are covered by your insurance, contact your insurance company or local agencies:  °[facility]: 1-800-256-2452 °[facility]: 888-581-9988 °[facility]: 1-800-939-5911 °Postpartum Support International- Warm-line: 1-800-944-4773  ° °                                                   __Outpatient Therapy and Medication Management  ° °Providers:  °[facility]: [phone] °Hours: 9AM-5PM  Insurance Accepted: AARP, Aetna, BCBS, Cigna, [employer], Humana, Medicare  °[facility]): [phone] °Hours: 8AM-5:30PM  nsurance Accepted: All insurances EXCEPT AARP, Aetna, [employer], and Humana °[facility]: [phone] °Hours: 8AM-8PM Insurance Accepted: Aetna, BCBS, Cigna, [employer], Medicaid, Medicare, Uninsured °[facility]: [phone]- [phone] °[facility]: [phone] °Hours: 8:30AM-7PM Insurance Accepted: Aetna, BCBS, Medicaid, Medicare, Tricare, United Healthcare °[facility]:  [phone]  ° Hours:9AM-5PM Insurance Accepted:  Aetna, BCBS, [facility], Medicaid, United Health Care  °[facility]: [phone] °Hours: 9AM-5:30PM Insurance Accepted: AARP, Aetna, BCBS, Cigna, and Medicaid, Medicare, United Health Care °[facility]:  [phone] °Hours: 9am-5pm Insurance Accepted: BCBS; they do not accept Medicaid/Medicare °[facility]: [phone] °Hours: 9am-9pm Insurance Accepted: All major insurance including Medicaid and Medicare °[facility]: [phone] °Hours: 9AM- 5PM Insurance Accepted: All insurances EXCEPT Medicaid and Medicare. °[facility]: [phone] ° ° °____________                                                                     Parenting Support Groups °[facility] : 336-832-6682 °[facility]:  [phone]- [phone] °Family Support Network: (support for children in the NICU   NICU and/or with special needs), 352-781-9267   ___________                                                                 Mental Health Support Groups Mental Health Association: 401-396-2580    _____________                                                                                  Online Resources Postpartum Support International: http://jones-berg.com/  202-542-7CWC 2Moms Supporting Moms:  www.momssupportingmoms.net

## 2021-03-31 ENCOUNTER — Encounter: Payer: Medicaid Other | Admitting: Family Medicine

## 2021-04-09 ENCOUNTER — Encounter (HOSPITAL_COMMUNITY): Payer: Self-pay

## 2021-04-09 ENCOUNTER — Ambulatory Visit (INDEPENDENT_AMBULATORY_CARE_PROVIDER_SITE_OTHER): Payer: Medicaid Other | Admitting: Advanced Practice Midwife

## 2021-04-09 ENCOUNTER — Other Ambulatory Visit: Payer: Self-pay

## 2021-04-09 ENCOUNTER — Other Ambulatory Visit (HOSPITAL_COMMUNITY)
Admission: RE | Admit: 2021-04-09 | Discharge: 2021-04-09 | Disposition: A | Discharge status: Home / Self Care | Payer: Medicaid Other | Source: Ambulatory Visit | Attending: Family Medicine | Admitting: Family Medicine

## 2021-04-09 ENCOUNTER — Encounter (HOSPITAL_COMMUNITY): Payer: Self-pay | Admitting: *Deleted

## 2021-04-09 ENCOUNTER — Telehealth (HOSPITAL_COMMUNITY): Payer: Self-pay | Admitting: *Deleted

## 2021-04-09 VITALS — BP 130/68 | HR 84 | Wt 245.4 lb

## 2021-04-09 DIAGNOSIS — O0990 Supervision of high risk pregnancy, unspecified, unspecified trimester: Secondary | ICD-10-CM

## 2021-04-09 DIAGNOSIS — N898 Other specified noninflammatory disorders of vagina: Secondary | ICD-10-CM

## 2021-04-09 DIAGNOSIS — O133 Gestational [pregnancy-induced] hypertension without significant proteinuria, third trimester: Secondary | ICD-10-CM

## 2021-04-09 DIAGNOSIS — O139 Gestational [pregnancy-induced] hypertension without significant proteinuria, unspecified trimester: Secondary | ICD-10-CM | POA: Insufficient documentation

## 2021-04-09 LAB — OB RESULTS CONSOLE GC/CHLAMYDIA: Gonorrhea: NEGATIVE

## 2021-04-09 NOTE — Progress Notes (Signed)
? ?  PRENATAL VISIT NOTE ? ?Subjective:  ?Brandi Rhodes is a 31 y.o. [id number] at [redacted]w[redacted]d being seen today for ongoing prenatal care.  She is currently monitored for the following issues for this high-risk pregnancy and has Supervision of high risk pregnancy, antepartum; History of prior pregnancy with short cervix, currently pregnant in second trimester; Gastroesophageal reflux during pregnancy in second trimester, antepartum; Alpha thalassemia silent carrier; and Gestational hypertension on their problem list. ? ?Patient reports  irregular Braxton Hicks contractions and requests that her cervix be checked today .  Contractions: Irritability. Vag. Bleeding: None.  Movement: Present. Denies leaking of fluid.  ? ?Patient is understandably stressed by diagnosis of Gestational Hypertension. Verbalizes understanding that we are recommending induction during 37th week but declines additional detailed discussion due to stress level.  ? ?The following portions of the patient's history were reviewed and updated as appropriate: allergies, current medications, past family history, past medical history, past social history, past surgical history and problem list. Problem list updated. ? ?Objective:  ? ?Vitals:  ? 04/09/21 1114 04/09/21 1127  ?BP: (!) 144/72 130/68  ?Pulse: 83 84  ?Weight: 245 lb 6.4 oz (111.3 kg)   ?  ? ?Fetal Status: Fetal Heart Rate (bpm): 135 Fundal Height: 38 cm Movement: Present  Presentation: Vertex ? ?General:  Alert, oriented and cooperative. Patient is in no acute distress.  ?Skin: Skin is warm and dry. No rash noted.   ?Cardiovascular: Normal heart rate noted  ?Respiratory: Normal respiratory effort, no problems with respiration noted  ?Abdomen: Soft, gravid, appropriate for gestational age.  Pain/Pressure: Present     ?Pelvic: Cervical exam performed Dilation: Closed Effacement (%): Thick Station: Ballotable  ?Extremities: Normal range of motion.  Edema: None  ?Mental Status: Normal mood and affect.  Normal behavior. Normal judgment and thought content.  ? ?Assessment and Plan:  ?Pregnancy: [id number] at [redacted]w[redacted]d ? ?1. Supervision of high risk pregnancy, antepartum ?- No acute concerns, closed cervix ?- Culture, beta strep (group b only) ?- Cervicovaginal ancillary only( [place]) ? ?2. Gestational hypertension, third trimester ?- 142/76 on 01/28/2021 at 26w 2d ?- 150/78 on 02/24/2021 at 0821  ?- 140/81 on 0/17/2023 at 1125 ?- 148/61 on 03/09/2021 ?- 144/72 on arrival today ?- Asymptomatic, normal PEC labs at 30 weeks ?- Per MFM recommendation, discussed scheduling IOL during 37th week of pregnancy ?- Patient agreeable to putting on schedule as close to end of 37th week as possible ?- IOL scheduled for midnight 03/12. Orders placed for Cytotec and bridge to foley bulb ?- Counseling on method not performed due to patient request ? ?3. Vaginal discharge ?- Thin whitish blue discharge noted at introitus, normal per patient, possibly Bacterial Vaginosis ?- Patient agreeable to swab for confirmatory diagnosis ?- Cervicovaginal ancillary only( [place]) ? ?Preterm labor symptoms and general obstetric precautions including but not limited to vaginal bleeding, contractions, leaking of fluid and fetal movement were reviewed in detail with the patient. ?Please refer to After Visit Summary for other counseling recommendations.  ?Return in about 1 week (around 04/16/2021) for APP or MD. ? ?Future Appointments  ?Date Time Provider [place]  ?04/19/2021 12:00 AM [facility] SCHED ROOM [facility]IN[facility] None  ?04/20/2021  8:55 AM Woodroe Mode, MD [facility]  ?04/27/2021 10:55 AM Griffin Basil, MD [facility]  ?05/04/2021 10:55 AM Aletha Halim, MD [facility]  ? ? ?Darlina Rumpf, CNM ? ?

## 2021-04-09 NOTE — Telephone Encounter (Signed)
Preadmission screen  

## 2021-04-10 LAB — CERVICOVAGINAL ANCILLARY ONLY
Bacterial Vaginitis (gardnerella): NEGATIVE
Candida Glabrata: NEGATIVE
Candida Vaginitis: NEGATIVE
Chlamydia: NEGATIVE
Comment: NEGATIVE
Comment: NEGATIVE
Comment: NEGATIVE
Comment: NEGATIVE
Comment: NEGATIVE
Comment: NORMAL
Neisseria Gonorrhea: NEGATIVE
Trichomonas: NEGATIVE

## 2021-04-12 ENCOUNTER — Other Ambulatory Visit: Payer: Self-pay | Admitting: Family Medicine

## 2021-04-13 LAB — CULTURE, BETA STREP (GROUP B ONLY): Strep Gp B Culture: NEGATIVE

## 2021-04-17 ENCOUNTER — Ambulatory Visit (INDEPENDENT_AMBULATORY_CARE_PROVIDER_SITE_OTHER): Payer: Medicaid Other

## 2021-04-17 ENCOUNTER — Other Ambulatory Visit: Payer: Self-pay

## 2021-04-17 ENCOUNTER — Other Ambulatory Visit: Payer: Self-pay | Admitting: Obstetrics & Gynecology

## 2021-04-17 ENCOUNTER — Encounter: Payer: Medicaid Other | Admitting: Family Medicine

## 2021-04-17 ENCOUNTER — Ambulatory Visit (INDEPENDENT_AMBULATORY_CARE_PROVIDER_SITE_OTHER): Payer: Medicaid Other | Admitting: Family Medicine

## 2021-04-17 VITALS — BP 133/70 | HR 85 | Wt 247.8 lb

## 2021-04-17 DIAGNOSIS — O133 Gestational [pregnancy-induced] hypertension without significant proteinuria, third trimester: Secondary | ICD-10-CM

## 2021-04-17 DIAGNOSIS — O0990 Supervision of high risk pregnancy, unspecified, unspecified trimester: Secondary | ICD-10-CM | POA: Diagnosis not present

## 2021-04-17 LAB — SARS CORONAVIRUS 2 (TAT 6-24 HRS): SARS Coronavirus 2: NEGATIVE

## 2021-04-17 NOTE — Addendum Note (Signed)
Addended by: Jill Side on: 04/17/2021 10:56 AM ? ? Modules accepted: Orders ? ?

## 2021-04-17 NOTE — Progress Notes (Signed)
? ?  Subjective:  ?Brandi Rhodes is a 31 y.o. [id number] at [redacted]w[redacted]d being seen today for ongoing prenatal care.  She is currently monitored for the following issues for this high-risk pregnancy and has Supervision of high risk pregnancy, antepartum; History of prior pregnancy with short cervix, currently pregnant in second trimester; Gastroesophageal reflux during pregnancy in second trimester, antepartum; Alpha thalassemia silent carrier; and Gestational hypertension on their problem list. ? ?Patient reports no complaints.  Contractions: Irritability. Vag. Bleeding: None.  Movement: Present. Denies leaking of fluid.  ? ?The following portions of the patient's history were reviewed and updated as appropriate: allergies, current medications, past family history, past medical history, past social history, past surgical history and problem list. Problem list updated. ? ?Objective:  ? ?Vitals:  ? 04/17/21 0924  ?BP: 133/70  ?Pulse: 85  ?Weight: 247 lb 12.8 oz (112.4 kg)  ? ? ?Fetal Status: Fetal Heart Rate (bpm): 172   Movement: Present    ? ?General:  Alert, oriented and cooperative. Patient is in no acute distress.  ?Skin: Skin is warm and dry. No rash noted.   ?Cardiovascular: Normal heart rate noted  ?Respiratory: Normal respiratory effort, no problems with respiration noted  ?Abdomen: Soft, gravid, appropriate for gestational age. Pain/Pressure: Absent     ?Pelvic: Vag. Bleeding: None     ?Cervical exam deferred        ?Extremities: Normal range of motion.  Edema: None  ?Mental Status: Normal mood and affect. Normal behavior. Normal judgment and thought content.  ? ?Urinalysis:     ? ?Assessment and Plan:  ?Pregnancy: [id number] at [redacted]w[redacted]d ? ?1. Supervision of high risk pregnancy, antepartum ?BP normal ?FHR slightly elevated, staying for BPP ? ?2. Gestational hypertension, third trimester ?Scheduled for midnight IOL on 04/19/2021 ?BP normotensive today ?Agreeable to staying for BPP, as long as this is reassuring can continue  with scheduled IOL, otherwise will send to L&D ? ?Term labor symptoms and general obstetric precautions including but not limited to vaginal bleeding, contractions, leaking of fluid and fetal movement were reviewed in detail with the patient. ?Please refer to After Visit Summary for other counseling recommendations.  ?Return in 1 week (on 04/24/2021). ? ? ?Venora Maples, MD ? ?

## 2021-04-17 NOTE — Patient Instructions (Signed)

## 2021-04-18 ENCOUNTER — Other Ambulatory Visit: Payer: Self-pay

## 2021-04-18 MED ORDER — MISOPROSTOL 50MCG HALF TABLET
50.0000 ug | ORAL_TABLET | ORAL | Status: DC | PRN
  Administered 2021-04-19 (×2): 50 ug via BUCCAL
  Filled 2021-04-18 (×2): qty 1

## 2021-04-18 MED ORDER — ACETAMINOPHEN 325 MG PO TABS: 650.0000 mg | ORAL_TABLET | ORAL | Status: DC | PRN

## 2021-04-18 MED ORDER — LIDOCAINE HCL (PF) 1 % IJ SOLN
30.0000 mL | INTRAMUSCULAR | Status: DC | PRN
Start: 2021-04-18 — End: 2021-04-20

## 2021-04-18 MED ORDER — OXYTOCIN BOLUS FROM INFUSION
333.0000 mL | Freq: Once | INTRAVENOUS | Status: AC
Start: 2021-04-19 — End: 2021-04-20
  Administered 2021-04-20: 333 mL via INTRAVENOUS

## 2021-04-18 MED ORDER — SOD CITRATE-CITRIC ACID 500-334 MG/5ML PO SOLN: 30.0000 mL | ORAL | Status: DC | PRN

## 2021-04-18 MED ORDER — ONDANSETRON HCL 4 MG/2ML IJ SOLN
4.0000 mg | Freq: Four times a day (QID) | INTRAMUSCULAR | Status: DC | PRN
Start: 2021-04-18 — End: 2021-04-20
  Administered 2021-04-19 – 2021-04-20 (×2): 4 mg via INTRAVENOUS
  Filled 2021-04-18 (×2): qty 2

## 2021-04-18 MED ORDER — OXYTOCIN-SODIUM CHLORIDE 30-0.9 UT/500ML-% IV SOLN
2.5000 [IU]/h | INTRAVENOUS | Status: DC
  Filled 2021-04-18 (×2): qty 500

## 2021-04-18 MED ORDER — LACTATED RINGERS IV SOLN
INTRAVENOUS | Status: DC
Start: 2021-04-19 — End: 2021-04-20

## 2021-04-18 MED ORDER — TERBUTALINE SULFATE 1 MG/ML IJ SOLN
0.2500 mg | Freq: Once | INTRAMUSCULAR | Status: DC | PRN
Start: 2021-04-18 — End: 2021-04-19

## 2021-04-18 MED ORDER — LACTATED RINGERS IV SOLN: 500.0000 mL | INTRAVENOUS | Status: DC | PRN

## 2021-04-19 ENCOUNTER — Inpatient Hospital Stay (HOSPITAL_COMMUNITY)
Admission: AD | Admit: 2021-04-19 | Discharge: 2021-04-21 | DRG: 807 | Disposition: A | Discharge status: Home / Self Care | Payer: Medicaid Other | Attending: Obstetrics & Gynecology | Admitting: Obstetrics & Gynecology

## 2021-04-19 ENCOUNTER — Inpatient Hospital Stay (HOSPITAL_COMMUNITY): Payer: Medicaid Other

## 2021-04-19 ENCOUNTER — Encounter (HOSPITAL_COMMUNITY): Payer: Self-pay | Admitting: Obstetrics & Gynecology

## 2021-04-19 DIAGNOSIS — Z3A38 38 weeks gestation of pregnancy: Secondary | ICD-10-CM | POA: Diagnosis not present

## 2021-04-19 DIAGNOSIS — D563 Thalassemia minor: Secondary | ICD-10-CM | POA: Diagnosis present

## 2021-04-19 DIAGNOSIS — O139 Gestational [pregnancy-induced] hypertension without significant proteinuria, unspecified trimester: Secondary | ICD-10-CM | POA: Diagnosis present

## 2021-04-19 DIAGNOSIS — O134 Gestational [pregnancy-induced] hypertension without significant proteinuria, complicating childbirth: Secondary | ICD-10-CM | POA: Diagnosis present

## 2021-04-19 DIAGNOSIS — Z87891 Personal history of nicotine dependence: Secondary | ICD-10-CM

## 2021-04-19 DIAGNOSIS — O09292 Supervision of pregnancy with other poor reproductive or obstetric history, second trimester: Secondary | ICD-10-CM

## 2021-04-19 DIAGNOSIS — Z3A37 37 weeks gestation of pregnancy: Secondary | ICD-10-CM

## 2021-04-19 DIAGNOSIS — O0990 Supervision of high risk pregnancy, unspecified, unspecified trimester: Secondary | ICD-10-CM

## 2021-04-19 LAB — COMPREHENSIVE METABOLIC PANEL
ALT: 13 U/L (ref 0–44)
AST: 19 U/L (ref 15–41)
Albumin: 3 g/dL — ABNORMAL LOW (ref 3.5–5.0)
Alkaline Phosphatase: 95 U/L (ref 38–126)
Anion gap: 10 (ref 5–15)
BUN: 7 mg/dL (ref 6–20)
CO2: 18 mmol/L — ABNORMAL LOW (ref 22–32)
Calcium: 9 mg/dL (ref 8.9–10.3)
Chloride: 105 mmol/L (ref 98–111)
Creatinine, Ser: 0.39 mg/dL — ABNORMAL LOW (ref 0.44–1.00)
GFR, Estimated: >60 mL/min (ref >60)
Glucose, Bld: 100 mg/dL — ABNORMAL HIGH (ref 70–99)
Potassium: 3.6 mmol/L (ref 3.5–5.1)
Sodium: 133 mmol/L — ABNORMAL LOW (ref 135–145)
Total Bilirubin: 0.1 mg/dL — ABNORMAL LOW (ref 0.3–1.2)
Total Protein: 6.3 g/dL — ABNORMAL LOW (ref 6.5–8.1)

## 2021-04-19 LAB — TYPE AND SCREEN
ABO/RH(D): O POS
Antibody Screen: NEGATIVE

## 2021-04-19 LAB — CBC
HCT: 35.5 % — ABNORMAL LOW (ref 36.0–46.0)
HCT: 38.7 % (ref 36.0–46.0)
Hemoglobin: 12 g/dL (ref 12.0–15.0)
Hemoglobin: 12.5 g/dL (ref 12.0–15.0)
MCH: 27.1 pg (ref 26.0–34.0)
MCH: 27.8 pg (ref 26.0–34.0)
MCHC: 32.3 g/dL (ref 30.0–36.0)
MCHC: 33.8 g/dL (ref 30.0–36.0)
MCV: 82.2 fL (ref 80.0–100.0)
MCV: 83.9 fL (ref 80.0–100.0)
Platelets: 253 10*3/uL (ref 150–400)
Platelets: 288 10*3/uL (ref 150–400)
RBC: 4.32 MIL/uL (ref 3.87–5.11)
RBC: 4.61 MIL/uL (ref 3.87–5.11)
RDW: 15.1 % (ref 11.5–15.5)
RDW: 15.1 % (ref 11.5–15.5)
WBC: 8.1 10*3/uL (ref 4.0–10.5)
WBC: 9.1 10*3/uL (ref 4.0–10.5)
nRBC: 0 % (ref 0.0–0.2)
nRBC: 0 % (ref 0.0–0.2)

## 2021-04-19 LAB — PROTEIN / CREATININE RATIO, URINE
Creatinine, Urine: 56.71 mg/dL
Protein Creatinine Ratio: 0.11 mg/mg{Cre} (ref 0.00–0.15)
Total Protein, Urine: 6 mg/dL

## 2021-04-19 LAB — RPR: RPR Ser Ql: NONREACTIVE

## 2021-04-19 MED ORDER — OXYTOCIN-SODIUM CHLORIDE 30-0.9 UT/500ML-% IV SOLN
1.0000 m[IU]/min | INTRAVENOUS | Status: DC
  Administered 2021-04-19: 2 m[IU]/min via INTRAVENOUS

## 2021-04-19 MED ORDER — PHENYLEPHRINE 40 MCG/ML (10ML) SYRINGE FOR IV PUSH (FOR BLOOD PRESSURE SUPPORT): 80.0000 ug | PREFILLED_SYRINGE | INTRAVENOUS | Status: DC | PRN

## 2021-04-19 MED ORDER — EPHEDRINE 5 MG/ML INJ: 10.0000 mg | INTRAVENOUS | Status: DC | PRN

## 2021-04-19 MED ORDER — FENTANYL CITRATE (PF) 100 MCG/2ML IJ SOLN
100.0000 ug | INTRAMUSCULAR | Status: DC | PRN
Start: 2021-04-19 — End: 2021-04-21
  Administered 2021-04-19: 100 ug via INTRAVENOUS
  Filled 2021-04-19 (×2): qty 2

## 2021-04-19 MED ORDER — FENTANYL-BUPIVACAINE-NACL 0.5-0.125-0.9 MG/250ML-% EP SOLN: 12.0000 mL/h | EPIDURAL | Status: DC | PRN

## 2021-04-19 MED ORDER — LACTATED RINGERS IV SOLN: 500.0000 mL | Freq: Once | INTRAVENOUS | Status: DC

## 2021-04-19 MED ORDER — DIPHENHYDRAMINE HCL 50 MG/ML IJ SOLN: 12.5000 mg | INTRAMUSCULAR | Status: DC | PRN

## 2021-04-19 MED ORDER — TERBUTALINE SULFATE 1 MG/ML IJ SOLN: 0.2500 mg | Freq: Once | INTRAMUSCULAR | Status: DC | PRN

## 2021-04-19 NOTE — Progress Notes (Signed)
Labor Progress Note ?Brandi Rhodes is a 31 y.o. [id number] at [redacted]w[redacted]d presented for IOL in the setting of gHTN ?S: Patient is resting , having some cramping but overall comfortable ? ?O:  ?BP (!) 129/53   Pulse 85   Temp 98.8 ?F (37.1 ?C) (Oral)   Resp 18   Ht 5\' 10"  (1.778 m)   Wt 114.2 kg   LMP 07/10/2020 (Exact Date)   SpO2 100%   BMI 36.13 kg/m?  ?EFM: FHR 130s/moderate variability/+accels, no decels ? ?CVE: Dilation: 1.5 ?Effacement (%): 60 ?Station: -3 ?Presentation: Vertex ?Exam by:: Dr. Cy Blamer, MD ? ? ?A&P: 31 y.o. [id number] [redacted]w[redacted]d  ?#Labor: FB still in place, contractions occurring every 4-5 minutes currently. Plan for additional dose of Cytotec ?#Pain: PRN ?#FWB: Cat I ?#GBS negative ? ?#gHTN ?BP ranges have been stable and in [id number] sytolics. Asymptomatic currently ?-serial BPs ? ?Gerrit Heck, [place] for [employer], [place] ?6:19 AM  ?

## 2021-04-19 NOTE — Progress Notes (Signed)
OB PN: ? ?S: Pt resting comfortably, no acute complaints ? ?O: BP (!) 143/68 (BP Location: Right Arm)   Pulse 88   Temp 98.3 ?F (36.8 ?C) (Oral)   Resp 18   Ht 5\' 10"  (1.778 m)   Wt 114.2 kg   LMP 07/10/2020 (Exact Date)   SpO2 100%   BMI 36.13 kg/m?  ? ?FHT: 140bpm, moderate variablity, + accels, no decels ?Toco: irregular ?SVE: 5/70/-3, AROM- blood-tinged ? ?A/P: 31 y.o. 26 @ [redacted]w[redacted]d for IOL due to gHTN  ?1. FWB: Cat. I ?2. Labor: continue Pit per protocol ?Pain:IV or epidural upon request ?GBS: neg ?3. Gest HTN ?-will continue to monitor ? ?[redacted]w[redacted]d, DO ?[phone] (cell) ?[phone] (office) ? ? ?  ?

## 2021-04-19 NOTE — Progress Notes (Signed)
Walking in hall

## 2021-04-19 NOTE — Progress Notes (Signed)
Brennley Curtice is a 31 y.o. [id number] at [redacted]w[redacted]d  ? ?Subjective: ?Resting in right lateral, speaking with friend/family on Facetime ? ?Objective: ?BP (!) 124/59   Pulse 87   Temp 98 ?F (36.7 ?C) (Oral)   Resp 16   Ht 5\' 10"  (1.778 m)   Wt 114.2 kg   LMP 07/10/2020 (Exact Date)   SpO2 100%   BMI 36.13 kg/m?  ?No intake/output data recorded. ?No intake/output data recorded. ? ?FHT:  FHR: 130 bpm, variability: moderate,  accelerations:  Present,  decelerations:  Absent ?UC:   occasional, as many as 7 minutes between contractions ?SVE:   Dilation: 4 ?Effacement (%): 70-80 ?Station: -3 ?Exam by: 09/09/2020, CNM ? ?Labs: ?Lab Results  ?Component Value Date  ? WBC 8.1 04/19/2021  ? HGB 12.5 04/19/2021  ? HCT 38.7 04/19/2021  ? MCV 83.9 04/19/2021  ? PLT 288 04/19/2021  ? ? ?Patient Vitals for the past 24 hrs: ? BP Temp Temp src Pulse Resp SpO2 Height Weight  ?04/19/21 0956 (!) 124/59 -- -- 87 16 -- -- --  ?04/19/21 0817 116/64 -- -- 81 18 -- -- --  ?04/19/21 0701 132/65 -- -- 86 -- -- -- --  ?04/19/21 0632 -- -- -- -- 18 -- -- --  ?04/19/21 0601 128/78 98 ?F (36.7 ?C) Oral 87 18 -- -- --  ?04/19/21 0546 (!) 110/57 -- -- 82 -- -- -- --  ?04/19/21 0531 (!) 129/53 -- -- 85 -- -- -- --  ?04/19/21 0516 (!) 122/59 -- -- 89 -- -- -- --  ?04/19/21 0501 112/64 -- -- 78 18 -- -- --  ?04/19/21 0446 (!) 121/47 -- -- 78 -- -- -- --  ?04/19/21 0431 127/60 -- -- 84 18 -- -- --  ?04/19/21 0332 -- -- -- 72 -- 100 % -- --  ?04/19/21 0142 (!) 143/83 -- -- 81 18 -- -- --  ?04/19/21 0036 -- -- -- -- -- -- 5\' 10"  (1.778 m) 114.2 kg  ?04/19/21 0023 135/85 98.8 ?F (37.1 ?C) Oral 95 16 -- -- --  ? ?Assessment / Plan: ?--31 y.o. 06/19/21 at [redacted]w[redacted]d  ?--Cat I tracing, GBS NEG ?--S/p foley bulb and Cytotec x 2 ?--Residual dark red blood show s/p foley, no active bleeding ?--GHTN: elevated BP x 1 since admission, asymptomatic ?--Initiate Pitocin 2 x 2, titrate as necessary ?--Per pt request, reviewed options for pain medication in labor ?--Anticipate  vaginal delivery ? ?[id number], CNM ?04/19/2021, 11:00 AM ? ? ?

## 2021-04-19 NOTE — Progress Notes (Signed)
Jlee Harkless is a 31 y.o. E7O3500 at [redacted]w[redacted]d  ? ?Subjective: ?Sitting in high fowler's eating breakfast. Reports feeling more uncomfortable. Family at bedside and supportive, engaged in care. ? ?Objective: ?BP 116/64   Pulse 81   Temp 98 ?F (36.7 ?C) (Oral)   Resp 18   Ht 5\' 10"  (1.778 m)   Wt 114.2 kg   LMP 07/10/2020 (Exact Date)   SpO2 100%   BMI 36.13 kg/m?  ?No intake/output data recorded. ?No intake/output data recorded. ? ?FHT:  FHR: 125 bpm, variability: moderate,  accelerations:  Present,  decelerations:  Absent ?UC:   irregular, every 2-5 minutes ?SVE:   Dilation: 1.5 ?Effacement (%): 60 ?Station: -3 ?Exam by:: Dr. [email or web address], MD ? ?Labs: ?Lab Results  ?Component Value Date  ? WBC 8.1 04/19/2021  ? HGB 12.5 04/19/2021  ? HCT 38.7 04/19/2021  ? MCV 83.9 04/19/2021  ? PLT 288 04/19/2021  ? ?Patient Vitals for the past 24 hrs: ? BP Temp Temp src Pulse Resp SpO2 Height Weight  ?04/19/21 0817 116/64 -- -- 81 18 -- -- --  ?04/19/21 0701 132/65 -- -- 86 -- -- -- --  ?04/19/21 0632 -- -- -- -- 18 -- -- --  ?04/19/21 0601 128/78 98 ?F (36.7 ?C) Oral 87 18 -- -- --  ?04/19/21 0546 (!) 110/57 -- -- 82 -- -- -- --  ?04/19/21 0531 (!) 129/53 -- -- 85 -- -- -- --  ?04/19/21 0516 (!) 122/59 -- -- 89 -- -- -- --  ?04/19/21 0501 112/64 -- -- 78 18 -- -- --  ?04/19/21 0446 (!) 121/47 -- -- 78 -- -- -- --  ?04/19/21 0431 127/60 -- -- 84 18 -- -- --  ?04/19/21 0332 -- -- -- 72 -- 100 % -- --  ?04/19/21 0142 (!) 143/83 -- -- 81 18 -- -- --  ?04/19/21 0036 -- -- -- -- -- -- 5\' 10"  (1.778 m) 114.2 kg  ?04/19/21 0023 135/85 98.8 ?F (37.1 ?C) Oral 95 16 -- -- --  ? ? ?Assessment / Plan: ?--30 y.o. at [redacted]w[redacted]d  ?--Scheduled IOL for GHTN ?--Predominantly normotensive since admission, asymptomatic ? ?Labor:  FB remains in place,Cytotec #2 given at [phone]. Continue Cytotec q 4 hours until FB dislodged  ?Fetal Wellbeing:  Category I ?Pain Control:  Epidural in active labor, on maternal request ?I/D:   GBS NEG ?Anticipated MOD:   Anticipate vaginal birth ? ?[redacted]w[redacted]d, MSA, MSN, CNM ?04/19/2021, 9:02 AM ? ? ?

## 2021-04-19 NOTE — H&P (Addendum)
OBSTETRIC ADMISSION HISTORY AND PHYSICAL ? ?Brandi Rhodes is a 31 y.o. female [phone] with IUP at 45w6dby ultrasound presenting for IOL in the setting of gHTN. BPP 10/10 on 3/10. She reports +FMs, No LOF, no VB, no blurry vision, headaches or peripheral edema, and RUQ pain.  She plans on breast feeding. She is unsure regarding birth control method. ?[facility]  ? ?Dating: By ultrasound --->  Estimated Date of Delivery: 05/04/21 ? ?Sono:   ? ?_0 , CWD, normal anatomy, cephalic presentation,  [id number] 73% EFW ? ?Prenatal History/Complications:  ?--gHTN ?--alpha thalassemia silent carrier ?--Hx of short cervix in prior pregnancy (4.2 cm on scan this pregnancy 11/10/2020)  ?--First pregnancy resulted in a fetal demise at around 13+ ? weeks requiring a D&C ?--Placenta previa in early scans-resolved in later scans ? ? ?Past Medical History: ?Past Medical History:  ?Diagnosis Date  ? Asthma   ? ? ?Past Surgical History: ?Past Surgical History:  ?Procedure Laterality Date  ? BREAST LUMPECTOMY Right   ? DILATION AND CURETTAGE OF UTERUS    ? TONSILLECTOMY    ? ? ?Obstetrical History: ?OB History   ? ? Gravida  ?5  ? Para  ?2  ? Term  ?2  ? Preterm  ?0  ? AB  ?2  ? Living  ?2  ?  ? ? SAB  ?2  ? IAB  ?0  ? Ectopic  ?0  ? Multiple  ?0  ? Live Births  ?2  ?   ?  ?  ? ? ?Social History ?Social History  ? ?Socioeconomic History  ? Marital status: Significant Other  ?  Spouse name: Not on file  ? Number of children: Not on file  ? Years of education: Not on file  ? Highest education level: Not on file  ?Occupational History  ? Not on file  ?Tobacco Use  ? Smoking status: Former  ?  Types: Cigarettes  ? Smokeless tobacco: Never  ?Vaping Use  ? Vaping Use: Former  ? Substances: Nicotine  ?Substance and Sexual Activity  ? Alcohol use: Not Currently  ? Drug use: Not Currently  ?  Types: Marijuana  ? Sexual activity: Yes  ?Other Topics Concern  ? Not on file  ?Social History Narrative  ? Not on file   ? ?Social Determinants of Health  ? ?Financial Resource Strain: Not on file  ?Food Insecurity: No Food Insecurity  ? Worried About RCharity fundraiserin the Last Year: Never true  ? Ran Out of Food in the Last Year: Never true  ?Transportation Needs: No Transportation Needs  ? Lack of Transportation (Medical): No  ? Lack of Transportation (Non-Medical): No  ?Physical Activity: Not on file  ?Stress: Not on file  ?Social Connections: Not on file  ? ? ?Family History: ?Family History  ?Problem Relation Age of Onset  ? Diabetes Maternal Grandmother   ? ? ?Allergies: ?No Known Allergies ? ?Pt denies allergies to latex, iodine, or shellfish. ? ?Medications Prior to Admission  ?Medication Sig Dispense Refill Last Dose  ? Prenatal Vit-Fe Fumarate-FA (PRENATAL PO) Take by mouth.   04/18/2021  ? Blood Pressure Monitoring (BLOOD PRESSURE KIT) DEVI 1 Device by Does not apply route as needed. 1 each 0   ? ? ? ?Review of Systems  ? ?All systems reviewed and negative except as stated in HPI ? ?Blood pressure 135/85, pulse 95, temperature 98.8 ?F (37.1 ?C), temperature source Oral, resp.  rate 16, height 5' 10" (1.778 m), weight 251 lb 12.8 oz (114.2 kg), last menstrual period 07/10/2020. ?General appearance: alert, cooperative, and mild distress ?Lungs: no iWOB ?Heart: regular rate  ?Abdomen: soft, non-tender ?Extremities: no sign of DVT ?Presentation: cephalic ?Fetal monitoringBaseline: 130 bpm, Variability: Good {> 6 bpm), Accelerations: Reactive, and Decelerations: Absent ?Uterine activityNone and Frequency: Every 3-4 minutes ?Dilation: 1.5 ?Effacement (%): 60 ?Station: -3 ?Exam by:: Dr. Cy Blamer, MD ? ? ?Prenatal labs: ?ABO, Rh: O+ (10/09/2020) ?Antibody: negative (1/17) ?Rubella: 2.99 (09/01 1212) ?RPR: Non Reactive (01/17 0818)  ?HBsAg: Negative (09/01 1212)  ?HIV: Non Reactive (01/17 0818)  ?GBS: Negative/-- (03/02 1136)  ?2 hr Glucola: Nml ?Genetic screening:  Panorama LR female, Horizon silent carrier alpha thallasemia ?Anatomy  US: Nml female ? ?Prenatal Transfer Tool  ?Maternal Diabetes: No ?Genetic Screening: Alpha thalassemia silent carrier ?Maternal Ultrasounds/Referrals: Normal ?Fetal Ultrasounds or other Referrals:  BPP for gHTN, 10/10 on 3/10 ?Maternal Substance Abuse:  No ?Significant Maternal Medications:  None ?Significant Maternal Lab Results: Group B Strep negative ? ?Results for orders placed or performed during the hospital encounter of 04/19/21 (from the past 24 hour(s))  ?CBC  ? Collection Time: 04/19/21 12:32 AM  ?Result Value Ref Range  ? WBC 8.1 4.0 - 10.5 K/uL  ? RBC 4.61 3.87 - 5.11 MIL/uL  ? Hemoglobin 12.5 12.0 - 15.0 g/dL  ? HCT 38.7 36.0 - 46.0 %  ? MCV 83.9 80.0 - 100.0 fL  ? MCH 27.1 26.0 - 34.0 pg  ? MCHC 32.3 30.0 - 36.0 g/dL  ? RDW 15.1 11.5 - 15.5 %  ? Platelets 288 150 - 400 K/uL  ? nRBC 0.0 0.0 - 0.2 %  ?Comprehensive metabolic panel  ? Collection Time: 04/19/21 12:32 AM  ?Result Value Ref Range  ? Sodium 133 (L) 135 - 145 mmol/L  ? Potassium 3.6 3.5 - 5.1 mmol/L  ? Chloride 105 98 - 111 mmol/L  ? CO2 18 (L) 22 - 32 mmol/L  ? Glucose, Bld 100 (H) 70 - 99 mg/dL  ? BUN 7 6 - 20 mg/dL  ? Creatinine, Ser 0.39 (L) 0.44 - 1.00 mg/dL  ? Calcium 9.0 8.9 - 10.3 mg/dL  ? Total Protein 6.3 (L) 6.5 - 8.1 g/dL  ? Albumin 3.0 (L) 3.5 - 5.0 g/dL  ? AST 19 15 - 41 U/L  ? ALT 13 0 - 44 U/L  ? Alkaline Phosphatase 95 38 - 126 U/L  ? Total Bilirubin 0.1 (L) 0.3 - 1.2 mg/dL  ? GFR, Estimated >60 >60 mL/min  ? Anion gap 10 5 - 15  ?Type and screen  ? Collection Time: 04/19/21 12:40 AM  ?Result Value Ref Range  ? ABO/RH(D) PENDING   ? Antibody Screen PENDING   ? Sample Expiration    ?  04/22/2021,2359 ?Performed at [facility], [place] [address], [place], [place] [address] ?  ? ? ?Patient Active Problem List  ? Diagnosis Date Noted  ? Gestational hypertension 04/09/2021  ? Alpha thalassemia silent carrier 02/23/2021  ? Gastroesophageal reflux during pregnancy in second trimester, antepartum 01/28/2021  ? Supervision of high  risk pregnancy, antepartum 10/09/2020  ? History of prior pregnancy with short cervix, currently pregnant in second trimester 10/09/2020  ? ? ?Assessment/Plan:  ?Metha Kolasa is a 31 y.o. [id number] at 24w6dhere for IOL d/t gHTN ? ?#Labor: Patient's cervix 1.5 cm dilated. Cooks balloon placed during check and will give buccal cytotec 575m as well.   ?#Pain: PRN  IV pain meds/nitrous oxide ?#FWB: Cat I ?#ID:  GBS negative ?#MOF: Breast ?#MOC: Undecided ?#Circ:  Desires inpatient ? ?#gHTN ?BP ranges have been [id number]-956O systolic/80s. Patient is asymptomatic. Ordered PreE labs-LFTs, platelets, Cr nml. P/C ratio  pending ?-Serial BPs ?-Monitor for sxs ? ?#Alpha thalassemia silent carrier ?Patient declined genetic counseling.  ? ?Gerrit Heck, PGY1 ? ?GME ATTESTATION:  ?I saw and evaluated the patient. I agree with the findings and the plan of care as documented in the resident?s note and have made all necessary edits. ? ?Renard Matter, MD, MPH ?OB Fellow, Faculty Practice ?[facility] ?04/19/2021, 1:35 AM ? ?  ?

## 2021-04-19 NOTE — Progress Notes (Signed)
Walking in room ?

## 2021-04-19 NOTE — Progress Notes (Signed)
Patient ID: Brandi Rhodes, female   DOB: 1991/02/05, 31 y.o.   MRN: [id number] ? ?CNM requested at bedside by patient and family for cervical exam 2/2 Pitocin augmentation. Cervix unchanged from previous. Reviewed options for pain management including unmedicated labor. Encouraged patient to adopt position changes out of bed when able. Continue Pitocin titration. ? ?Clayton Bibles, MSA, MSN, CNM ?Certified Nurse Midwife, Faculty Practice ?[facility] for [employer], [facility] ? ? ?

## 2021-04-20 ENCOUNTER — Encounter (HOSPITAL_COMMUNITY): Payer: Self-pay | Admitting: Obstetrics & Gynecology

## 2021-04-20 ENCOUNTER — Encounter: Payer: Medicaid Other | Admitting: Obstetrics & Gynecology

## 2021-04-20 DIAGNOSIS — O134 Gestational [pregnancy-induced] hypertension without significant proteinuria, complicating childbirth: Secondary | ICD-10-CM

## 2021-04-20 DIAGNOSIS — Z3A38 38 weeks gestation of pregnancy: Secondary | ICD-10-CM

## 2021-04-20 LAB — CBC
HCT: 35.6 % — ABNORMAL LOW (ref 36.0–46.0)
Hemoglobin: 11.9 g/dL — ABNORMAL LOW (ref 12.0–15.0)
MCH: 27.4 pg (ref 26.0–34.0)
MCHC: 33.4 g/dL (ref 30.0–36.0)
MCV: 82 fL (ref 80.0–100.0)
Platelets: 263 10*3/uL (ref 150–400)
RBC: 4.34 MIL/uL (ref 3.87–5.11)
RDW: 15 % (ref 11.5–15.5)
WBC: 13.8 10*3/uL — ABNORMAL HIGH (ref 4.0–10.5)
nRBC: 0 % (ref 0.0–0.2)

## 2021-04-20 MED ORDER — METHYLERGONOVINE MALEATE 0.2 MG/ML IJ SOLN: 0.2000 mg | Freq: Four times a day (QID) | INTRAMUSCULAR | Status: DC | PRN

## 2021-04-20 MED ORDER — TETANUS-DIPHTH-ACELL PERTUSSIS 5-2.5-18.5 LF-MCG/0.5 IM SUSY: 0.5000 mL | PREFILLED_SYRINGE | Freq: Once | INTRAMUSCULAR | Status: DC

## 2021-04-20 MED ORDER — DIBUCAINE (PERIANAL) 1 % EX OINT: 1.0000 "application " | TOPICAL_OINTMENT | CUTANEOUS | Status: DC | PRN

## 2021-04-20 MED ORDER — ACETAMINOPHEN 325 MG PO TABS
650.0000 mg | ORAL_TABLET | ORAL | Status: DC | PRN
  Administered 2021-04-20 (×2): 650 mg via ORAL
  Filled 2021-04-20: qty 2

## 2021-04-20 MED ORDER — SIMETHICONE 80 MG PO CHEW: 80.0000 mg | CHEWABLE_TABLET | ORAL | Status: DC | PRN

## 2021-04-20 MED ORDER — MISOPROSTOL 200 MCG PO TABS
1000.0000 ug | ORAL_TABLET | Freq: Once | ORAL | Status: AC
  Administered 2021-04-20: 1000 ug via RECTAL

## 2021-04-20 MED ORDER — ONDANSETRON HCL 4 MG PO TABS: 4.0000 mg | ORAL_TABLET | ORAL | Status: DC | PRN

## 2021-04-20 MED ORDER — COCONUT OIL OIL: 1.0000 "application " | TOPICAL_OIL | Status: DC | PRN

## 2021-04-20 MED ORDER — IBUPROFEN 600 MG PO TABS
600.0000 mg | ORAL_TABLET | Freq: Four times a day (QID) | ORAL | Status: DC
  Administered 2021-04-20 (×3): 600 mg via ORAL
  Filled 2021-04-20 (×5): qty 1

## 2021-04-20 MED ORDER — DIPHENHYDRAMINE HCL 25 MG PO CAPS: 25.0000 mg | ORAL_CAPSULE | Freq: Four times a day (QID) | ORAL | Status: DC | PRN

## 2021-04-20 MED ORDER — METHYLERGONOVINE MALEATE 0.2 MG/ML IJ SOLN
INTRAMUSCULAR | Status: AC
  Administered 2021-04-20: 0.2 mg via INTRAMUSCULAR
  Filled 2021-04-20: qty 1

## 2021-04-20 MED ORDER — TRANEXAMIC ACID-NACL 1000-0.7 MG/100ML-% IV SOLN
INTRAVENOUS | Status: AC
  Administered 2021-04-20: 1000 mg
  Filled 2021-04-20: qty 100

## 2021-04-20 MED ORDER — METHYLERGONOVINE MALEATE 0.2 MG PO TABS: 0.2000 mg | ORAL_TABLET | Freq: Four times a day (QID) | ORAL | Status: DC | PRN

## 2021-04-20 MED ORDER — BENZOCAINE-MENTHOL 20-0.5 % EX AERO: 1.0000 "application " | INHALATION_SPRAY | CUTANEOUS | Status: DC | PRN

## 2021-04-20 MED ORDER — PRENATAL MULTIVITAMIN CH
1.0000 | ORAL_TABLET | Freq: Every day | ORAL | Status: DC
  Administered 2021-04-20 – 2021-04-21 (×2): 1 via ORAL
  Filled 2021-04-20 (×2): qty 1

## 2021-04-20 MED ORDER — WITCH HAZEL-GLYCERIN EX PADS: 1.0000 "application " | MEDICATED_PAD | CUTANEOUS | Status: DC | PRN

## 2021-04-20 MED ORDER — ONDANSETRON HCL 4 MG/2ML IJ SOLN: 4.0000 mg | INTRAMUSCULAR | Status: DC | PRN

## 2021-04-20 MED ORDER — MISOPROSTOL 200 MCG PO TABS
ORAL_TABLET | ORAL | Status: AC
Start: 2021-04-20 — End: 2021-04-20
  Filled 2021-04-20: qty 4

## 2021-04-20 MED ORDER — SENNOSIDES-DOCUSATE SODIUM 8.6-50 MG PO TABS
2.0000 | ORAL_TABLET | ORAL | Status: DC
  Administered 2021-04-20: 2 via ORAL
  Filled 2021-04-20: qty 2

## 2021-04-20 MED ORDER — OXYCODONE HCL 5 MG PO TABS
10.0000 mg | ORAL_TABLET | Freq: Once | ORAL | Status: AC
  Administered 2021-04-20: 10 mg via ORAL
  Filled 2021-04-20: qty 2

## 2021-04-20 NOTE — Discharge Summary (Shared)
° °  Postpartum Discharge Summary ° °Date of Service updated*** ° °   °Patient Name: Brandi Rhodes °DOB: 07/13/1990 °MRN: [id number] ° °Date of admission: 04/19/2021 °Delivery date:04/20/2021  °Delivering provider: KOOISTRA, KATHRYN LORRAINE  °Date of discharge: 04/20/2021 ° °Admitting diagnosis: induction °Intrauterine pregnancy: [redacted]w[redacted]d     °Secondary diagnosis:  Active Problems: °  Supervision of high risk pregnancy, antepartum °  History of prior pregnancy with short cervix, currently pregnant in second trimester °  Gestational hypertension ° °Additional problems: ***    °Discharge diagnosis: Term Pregnancy Delivered                                              °Post partum procedures:{Postpartum procedures:23558} °Augmentation: AROM, Pitocin, Cytotec, and OP Foley °Complications: None ° °Hospital course: Induction of Labor With Vaginal Delivery   °Brandi Rhodes was admitted to the hospital 04/19/2021 for induction of labor.  Indication for induction: Gestational hypertension.  Patient had an uncomplicated labor course as follows: °Membrane Rupture Time/Date: 8:30 PM ,04/19/2021   °Delivery Method:Vaginal, Spontaneous  °Episiotomy: None  °Lacerations:  None  °Details of delivery can be found in separate delivery note.  Patient had a routine postpartum course. Patient is discharged home 04/20/21. ° °Newborn Data: °Birth date:04/20/2021  °Birth time:3:32 AM  °Gender:Female  °Living status:Living  °Apgars:8 ,9  °Weight:  ° °Magnesium Sulfate received: No °BMZ received: No °Rhophylac:No °MMR:{MMR:30440033} °T-DaP:{Tdap:23962} °Flu: {Flu:23963} °Transfusion:{Transfusion received:30440034} ° °Physical exam  °Vitals:  ° 04/20/21 0201 04/20/21 0400 04/20/21 0415 04/20/21 0430  °BP: 137/75 (!) 144/66 134/67 134/79  °Pulse: 90 89 86 93  °Resp: 18 18 18 18  °Temp:   98.2 °F (36.8 °C)   °TempSrc:   Oral   °SpO2:      °Weight:      °Height:      ° °General: {Exam; general:21111117} °Lochia: {Desc;  appropriate/inappropriate:30686::"appropriate"} °Uterine Fundus: {Desc; firm/soft:30687} °Incision: {Exam; incision:21111123} °DVT Evaluation: {Exam; dvt:2111122} °Labs: °Lab Results  °Component Value Date  ° WBC 9.1 04/19/2021  ° HGB 12.0 04/19/2021  ° HCT 35.5 (L) 04/19/2021  ° MCV 82.2 04/19/2021  ° PLT 253 04/19/2021  ° °CMP Latest Ref Rng & Units 04/19/2021  °Glucose 70 - 99 mg/dL 100(H)  °BUN 6 - 20 mg/dL 7  °Creatinine 0.44 - 1.00 mg/dL 0.39(L)  °Sodium 135 - 145 mmol/L 133(L)  °Potassium 3.5 - 5.1 mmol/L 3.6  °Chloride 98 - 111 mmol/L 105  °CO2 22 - 32 mmol/L 18(L)  °Calcium 8.9 - 10.3 mg/dL 9.0  °Total Protein 6.5 - 8.1 g/dL 6.3(L)  °Total Bilirubin 0.3 - 1.2 mg/dL 0.1(L)  °Alkaline Phos 38 - 126 U/L 95  °AST 15 - 41 U/L 19  °ALT 0 - 44 U/L 13  ° °Edinburgh Score: °No flowsheet data found. ° ° °After visit meds:  °Allergies as of 04/20/2021   °No Known Allergies °  °Med Rec must be completed prior to using this SMARTLINK*** ° ° ° ° ° ° ° °Discharge home in stable condition °Infant Feeding: {Baby feeding:23562} °Infant Disposition:{CHL IP OB HOME WITH MOTHER:23581} °Discharge instruction: per After Visit Summary and Postpartum booklet. °Activity: Advance as tolerated. Pelvic rest for 6 weeks.  °Diet: {OB diet:21111121} °Future Appointments:No future appointments. °Follow up Visit: ° °PP Message sent by KK on 04/20/2021 ° °Please schedule this patient for a Virtual postpartum visit in   in 6 weeks with the following provider: Any provider. Additional Postpartum F/U:BP check 1 week  High risk pregnancy complicated by: HTN Delivery mode:  Vaginal, Spontaneous  Anticipated Birth Control:  Unsure   04/20/2021 Starr Lake, CNM

## 2021-04-20 NOTE — Progress Notes (Signed)
LABOR PROGRESS NOTE ? ?Ferris Tally is a 31 y.o. [id number] at [redacted]w[redacted]d  admitted for IOL due to gHTN ? ?Subjective: ?Pt feeling more painful contractions, s/p IV pain Fentanyl ? ?Objective: ?BP 122/62 (BP Location: Left Arm)   Pulse 85   Temp 97.7 ?F (36.5 ?C) (Oral)   Resp 18   Ht 5\' 10"  (1.778 m)   Wt 114.2 kg   LMP 07/10/2020 (Exact Date)   SpO2 100%   BMI 36.13 kg/m?  or  ?Vitals:  ? 04/19/21 2316 04/19/21 2320 04/20/21 0001 04/20/21 0101  ?BP:  129/68 (!) 143/69 122/62  ?Pulse:  63 66 85  ?Resp:  18 18 18   ?Temp: 97.7 ?F (36.5 ?C)     ?TempSrc: Oral     ?SpO2:      ?Weight:      ?Height:      ? ? ?Gen: NAD ? ?FHT: baseline rate 125, moderate varibility, +acel, nodecel ?Toco: q3-39min ?SVE: deferred- last exam around 1015- 5.5/80/-2 ? ?Labs: ?Lab Results  ?Component Value Date  ? WBC 9.1 04/19/2021  ? HGB 12.0 04/19/2021  ? HCT 35.5 (L) 04/19/2021  ? MCV 82.2 04/19/2021  ? PLT 253 04/19/2021  ? ? ? ?Assessment / Plan: ?31 y.o. 06/19/2021 at [redacted]w[redacted]d here for IOL due to gHTN ? ?Labor: continue Pit per protocol ?If no further change at next cervical exam, will plan to place [facility] ?Fetal Wellbeing:  Cat. I ?Pain Control:  IV or epidural upon request ?GBS status: neg ? ? ?[id number], DO ?Attending Obstetrician & Gynecologist, Faculty Practice ?Center for [redacted]w[redacted]d, [facility] ? ?  ?

## 2021-04-20 NOTE — Lactation Note (Signed)
This note was copied from a baby's chart. ?Lactation Consultation Note ? ?Patient Name: Brandi Rhodes ?Today's Date: 04/20/2021 ?Reason for consult: Initial assessment;Early term 37-38.6wks ?Age:31 hours ? ?P3, Ex BF.  Mother states baby recently breastfed. ?Reviewed basics.  Feed on demand with cues.  Goal 8-12+ times per day after first 24 hrs.  Place baby STS if not cueing. Discussed if baby becomes sleepy at the breast to call for LC to setup DEBP. ?Mom made aware of O/P services, breastfeeding support groups, community resources, and our phone # for post-discharge questions.  ? ? ? ?Maternal Data ?Has patient been taught Hand Expression?: Yes ?Does the patient have breastfeeding experience prior to this delivery?: Yes ?How long did the patient breastfeed?: 18 mos & 12 mos. ? ?Feeding ?Mother's Current Feeding Choice: Breast Milk ? ?Interventions ?Interventions: Breast feeding basics reviewed;Education;LC Services brochure ? ?Consult Status ?Consult Status: Follow-up ?Date: 04/21/21 ?Follow-up type: In-patient ? ? ? ?Dahlia Byes Boschen ?04/20/2021, 10:36 AM ? ? ? ?

## 2021-04-21 MED ORDER — IBUPROFEN 600 MG PO TABS: 600.0000 mg | ORAL_TABLET | Freq: Four times a day (QID) | ORAL | 0 refills | Status: AC | PRN

## 2021-04-21 MED ORDER — ACETAMINOPHEN 500 MG PO TABS: 1000.0000 mg | ORAL_TABLET | Freq: Three times a day (TID) | ORAL | 0 refills | Status: DC | PRN

## 2021-04-21 NOTE — Social Work (Signed)
CSW received consult for hx of marijuana use.  Referral was screened out due to the following: ? ?~MOB had no documented substance use after initial prenatal visit/+UPT. ?~MOB had no positive drug screens after initial prenatal visit/+UPT. ? ?Please consult CSW if current concerns arise or by MOB's request. ? ?CSW will monitor CDS results and make report to Child Protective Services if warranted.  ? ?Manfred Arch, LCSW ?Clinical Social Work ?[facility] ?[phone] ?

## 2021-04-25 ENCOUNTER — Encounter (HOSPITAL_COMMUNITY): Payer: Self-pay | Admitting: Obstetrics & Gynecology

## 2021-04-27 ENCOUNTER — Encounter: Payer: Medicaid Other | Admitting: Obstetrics and Gynecology

## 2021-04-27 ENCOUNTER — Ambulatory Visit (INDEPENDENT_AMBULATORY_CARE_PROVIDER_SITE_OTHER): Payer: Medicaid Other | Admitting: General Practice

## 2021-04-27 ENCOUNTER — Other Ambulatory Visit: Payer: Self-pay

## 2021-04-27 VITALS — BP 136/82 | HR 105 | Ht 70.0 in | Wt 243.0 lb

## 2021-04-27 DIAGNOSIS — Z0130 Encounter for examination of blood pressure without abnormal findings: Secondary | ICD-10-CM

## 2021-04-27 DIAGNOSIS — O165 Unspecified maternal hypertension, complicating the puerperium: Secondary | ICD-10-CM

## 2021-04-27 MED ORDER — NIFEDIPINE ER OSMOTIC RELEASE 30 MG PO TB24: 30.0000 mg | ORAL_TABLET | Freq: Every day | ORAL | 1 refills | Status: AC

## 2021-04-27 NOTE — Progress Notes (Signed)
Patient presents to office today for BP check following vaginal delivery on 3/13. Her pregnancy was complicated by [facility]. Patient was not sent home on any meds. She denies headaches, dizziness, or blurry vision. BP 136/82. Discussed with Leticia Penna who recommends patient start Procardia 30mg  daily and return in 1 week for repeat BP check. Rx prescribed & discussed with patient. Patient verbalized understanding and will return next week for BP check. ? ? RN BSN ?04/27/21 ? ?

## 2021-05-04 ENCOUNTER — Ambulatory Visit: Payer: Medicaid Other

## 2021-05-04 ENCOUNTER — Encounter: Payer: Medicaid Other | Admitting: Obstetrics and Gynecology

## 2021-05-04 ENCOUNTER — Telehealth: Payer: Self-pay

## 2021-05-04 NOTE — Telephone Encounter (Signed)
Called pt to follow up on missed appt for BP check. VM left encouraging pt to reschedule. MyChart message sent. ?

## 2021-06-01 ENCOUNTER — Ambulatory Visit (INDEPENDENT_AMBULATORY_CARE_PROVIDER_SITE_OTHER): Payer: Medicaid Other | Admitting: Family Medicine

## 2021-06-01 ENCOUNTER — Encounter: Payer: Self-pay | Admitting: Family Medicine

## 2021-06-01 DIAGNOSIS — O133 Gestational [pregnancy-induced] hypertension without significant proteinuria, third trimester: Secondary | ICD-10-CM | POA: Diagnosis not present

## 2021-06-01 NOTE — Progress Notes (Signed)
? ? ?Post Partum Visit Note ? ?Brandi Rhodes is a 31 y.o. [phone] female who presents for a postpartum visit. She is 6 weeks postpartum following a normal spontaneous vaginal delivery.  I have fully reviewed the prenatal and intrapartum course. The delivery was at 38.0 gestational weeks.  Anesthesia: none. Postpartum course has been uneventful. Baby is doing well. Baby is feeding by breast. Bleeding no bleeding. Bowel function is normal. Bladder function is normal. Patient is not sexually active. Contraception method is  unsure . Postpartum depression screening: negative. ? ? ?The pregnancy intention screening data noted above was reviewed. Potential methods of contraception were discussed. The patient elected to proceed with No data recorded. ? ? Edinburgh Postnatal Depression Scale - 06/01/21 0930   ? ?  ? Edinburgh Postnatal Depression Scale:  In the Past 7 Days  ? I have been able to laugh and see the funny side of things. 0   ? I have looked forward with enjoyment to things. 0   ? I have blamed myself unnecessarily when things went wrong. 0   ? I have been anxious or worried for no good reason. 0   ? I have felt scared or panicky for no good reason. 0   ? Things have been getting on top of me. 0   ? I have been so unhappy that I have had difficulty sleeping. 0   ? I have felt sad or miserable. 0   ? I have been so unhappy that I have been crying. 0   ? The thought of harming myself has occurred to me. 0   ? Edinburgh Postnatal Depression Scale Total 0   ? ?  ?  ? ?  ? ? ?Health Maintenance Due  ?Topic Date Due  ? COVID-19 Vaccine (1) Never done  ? ? ?The following portions of the patient's history were reviewed and updated as appropriate: allergies, current medications, past family history, past medical history, past social history, past surgical history, and problem list. ? ?Review of Systems ?Pertinent items noted in HPI and remainder of comprehensive ROS otherwise negative. ? ?Objective:  ?BP 123/82    Pulse (!) 114   Wt 229 lb 4.8 oz (104 kg)   LMP  (LMP Unknown)   Breastfeeding Yes   BMI 32.90 kg/m?   ? ?General:  alert, cooperative, and appears stated age  ? Breasts:  not indicated  ?Lungs: Comfortable on room air  ?GU exam:  not indicated  ?     ?Assessment:  ? ? There are no diagnoses linked to this encounter. ? ?Normal postpartum exam.  ? ?Plan:  ? ?Essential components of care per ACOG recommendations: ? ?1.  Mood and well being: Patient with negative depression screening today. Reviewed local resources for support.  ?- Patient tobacco use? No.   ?- hx of drug use? No.   ? ?2. Infant care and feeding:  ?-Patient currently breastmilk feeding? Yes. Reviewed importance of draining breast regularly to support lactation.  ?-Social determinants of health (SDOH) reviewed in EPIC. No concerns ? ?3. Sexuality, contraception and birth spacing ?- Patient does not want a pregnancy in the next year.  Desired family size is 3 children.  ?- Reviewed reproductive life planning. Reviewed contraceptive methods based on pt preferences and effectiveness.  Patient desired unsure method today, she will consider.   ?- Discussed birth spacing of 18 months ? ?4. Sleep and fatigue ?-Encouraged family/partner/community support of 4 hrs of uninterrupted sleep to help with  mood and fatigue ? ?5. Physical Recovery  ?- Discussed patients delivery and complications. She describes her labor as good. ?- Patient had a Vaginal, no problems at delivery. Patient had no laceration. Perineal healing reviewed. Patient expressed understanding ?- Patient has urinary incontinence? No. ?- Patient is safe to resume physical and sexual activity ? ?6.  Health Maintenance ?- HM due items addressed No - up to date ?- Last pap smear  ?Diagnosis  ?Date Value Ref Range Status  ?10/09/2020   Final  ? - Negative for intraepithelial lesion or malignancy (NILM)  ? Pap smear not done at today's visit.  ?-Breast Cancer screening indicated? No.  ? ?7. Chronic  Disease/Pregnancy Condition follow up: Hypertension ?- reports has not taken Nifedipine for one week, normotensive today, discussed increased lifetime risk of HTN and need for regular screening ?- PCP follow up ? ?Venora Maples, MD ?Center for [facility], [facility]  ?

## 2022-03-19 ENCOUNTER — Encounter (HOSPITAL_COMMUNITY): Payer: Self-pay

## 2022-03-19 ENCOUNTER — Other Ambulatory Visit: Payer: Self-pay

## 2022-03-19 ENCOUNTER — Emergency Department (HOSPITAL_COMMUNITY): Payer: Medicaid Other

## 2022-03-19 ENCOUNTER — Telehealth (HOSPITAL_COMMUNITY): Payer: Self-pay

## 2022-03-19 ENCOUNTER — Emergency Department (HOSPITAL_COMMUNITY)
Admission: EM | Admit: 2022-03-19 | Discharge: 2022-03-19 | Disposition: A | Discharge status: Home / Self Care | Payer: Medicaid Other | Attending: Emergency Medicine | Admitting: Emergency Medicine

## 2022-03-19 DIAGNOSIS — I480 Paroxysmal atrial fibrillation: Secondary | ICD-10-CM | POA: Diagnosis not present

## 2022-03-19 DIAGNOSIS — R002 Palpitations: Secondary | ICD-10-CM | POA: Diagnosis present

## 2022-03-19 DIAGNOSIS — R0602 Shortness of breath: Secondary | ICD-10-CM | POA: Diagnosis not present

## 2022-03-19 LAB — TSH: TSH: 0.521 u[IU]/mL (ref 0.350–4.500)

## 2022-03-19 LAB — COMPREHENSIVE METABOLIC PANEL
ALT: 15 U/L (ref 0–44)
AST: 22 U/L (ref 15–41)
Albumin: 4.3 g/dL (ref 3.5–5.0)
Alkaline Phosphatase: 76 U/L (ref 38–126)
Anion gap: 16 — ABNORMAL HIGH (ref 5–15)
BUN: 6 mg/dL (ref 6–20)
CO2: 21 mmol/L — ABNORMAL LOW (ref 22–32)
Calcium: 9.3 mg/dL (ref 8.9–10.3)
Chloride: 105 mmol/L (ref 98–111)
Creatinine, Ser: 0.72 mg/dL (ref 0.44–1.00)
GFR, Estimated: >60 mL/min (ref >60)
Glucose, Bld: 97 mg/dL (ref 70–99)
Potassium: 4.1 mmol/L (ref 3.5–5.1)
Sodium: 142 mmol/L (ref 135–145)
Total Bilirubin: 0.4 mg/dL (ref 0.3–1.2)
Total Protein: 8.1 g/dL (ref 6.5–8.1)

## 2022-03-19 LAB — CBC WITH DIFFERENTIAL/PLATELET
Abs Immature Granulocytes: 0.06 10*3/uL (ref 0.00–0.07)
Basophils Absolute: 0.1 10*3/uL (ref 0.0–0.1)
Basophils Relative: 1 %
Eosinophils Absolute: 0.1 10*3/uL (ref 0.0–0.5)
Eosinophils Relative: 2 %
HCT: 56.6 % — ABNORMAL HIGH (ref 36.0–46.0)
Hemoglobin: 18.1 g/dL — ABNORMAL HIGH (ref 12.0–15.0)
Immature Granulocytes: 1 %
Lymphocytes Relative: 42 %
Lymphs Abs: 2.8 10*3/uL (ref 0.7–4.0)
MCH: 26.9 pg (ref 26.0–34.0)
MCHC: 32 g/dL (ref 30.0–36.0)
MCV: 84.2 fL (ref 80.0–100.0)
Monocytes Absolute: 0.5 10*3/uL (ref 0.1–1.0)
Monocytes Relative: 7 %
Neutro Abs: 3.2 10*3/uL (ref 1.7–7.7)
Neutrophils Relative %: 47 %
Platelets: 411 10*3/uL — ABNORMAL HIGH (ref 150–400)
RBC: 6.72 MIL/uL — ABNORMAL HIGH (ref 3.87–5.11)
RDW: 17 % — ABNORMAL HIGH (ref 11.5–15.5)
WBC: 6.6 10*3/uL (ref 4.0–10.5)
nRBC: 0 % (ref 0.0–0.2)

## 2022-03-19 LAB — TROPONIN I (HIGH SENSITIVITY)
Troponin I (High Sensitivity): 3 ng/L (ref <18)
Troponin I (High Sensitivity): 4 ng/L (ref <18)

## 2022-03-19 LAB — D-DIMER, QUANTITATIVE: D-Dimer, Quant: <0.27 ug/mL-FEU (ref 0.00–0.50)

## 2022-03-19 LAB — I-STAT BETA HCG BLOOD, ED (MC, WL, AP ONLY): I-stat hCG, quantitative: <5 m[IU]/mL (ref <5)

## 2022-03-19 LAB — BRAIN NATRIURETIC PEPTIDE: B Natriuretic Peptide: 29.7 pg/mL (ref 0.0–100.0)

## 2022-03-19 MED ORDER — METOPROLOL TARTRATE 25 MG PO TABS
50.0000 mg | ORAL_TABLET | Freq: Once | ORAL | Status: AC
  Administered 2022-03-19: 50 mg via ORAL
  Filled 2022-03-19: qty 2

## 2022-03-19 MED ORDER — DILTIAZEM HCL 25 MG/5ML IV SOLN
10.0000 mg | Freq: Once | INTRAVENOUS | Status: AC
  Administered 2022-03-19: 10 mg via INTRAVENOUS
  Filled 2022-03-19: qty 5

## 2022-03-19 MED ORDER — DILTIAZEM HCL ER COATED BEADS 120 MG PO CP24: 120.0000 mg | ORAL_CAPSULE | Freq: Every day | ORAL | 0 refills | Status: AC

## 2022-03-19 MED ORDER — DILTIAZEM HCL 30 MG PO TABS: 30.0000 mg | ORAL_TABLET | Freq: Three times a day (TID) | ORAL | 0 refills | Status: AC | PRN

## 2022-03-19 NOTE — Discharge Instructions (Addendum)
You have atrial fibrillation.  Your rate will be controlled with medication called Cardizem.  You declined anticoagulation today.  Take the extended release CD tablet every day.  Take the short acting Cardizem 30 mg as needed for palpitations.  Follow-up with cardiologist with atrial fibrillation clinic next week.  Return to the ED with difficulty breathing, chest pain, palpitations or other concerns.

## 2022-03-19 NOTE — ED Provider Notes (Addendum)
[place] EMERGENCY DEPARTMENT AT [facility] Provider Note   CSN: [id number] Arrival date & time: 03/19/22  [id number]     History  Chief Complaint  Patient presents with   Tachycardia    Brandi Rhodes is a 32 y.o. female.  Patient presents with racing heart feeling and palpitations since last night.  Started around 10 PM after drinking some ice water.  Has had atrial fibrillation in the past but is no longer on medications for it.  No blood thinners.  Last episode of A-fib was about 8 years ago.  She feels some shortness of breath but no chest pain.  No dizziness or lightheadedness.  No abdominal pain, nausea or vomiting.  No cough, fever, runny nose or sore throat. Currently breast-feeding.  Denies any possibility of pregnancy.  The history is provided by the patient and a relative.       Home Medications Prior to Admission medications   Medication Sig Start Date End Date Taking? Authorizing Provider  ibuprofen (ADVIL) 600 MG tablet Take 1 tablet (600 mg total) by mouth every 6 (six) hours as needed (pain). Patient not taking: Reported on 06/01/2021 04/21/21   Genia Del, MD  NIFEdipine (PROCARDIA XL) 30 MG 24 hr tablet Take 1 tablet (30 mg total) by mouth daily. Patient not taking: Reported on 06/01/2021 04/27/21   Patriciaann Clan, DO  Prenatal Vit-Fe Fumarate-FA (PRENATAL PO) Take 1 tablet by mouth daily.    [provider]      Allergies    Patient has no known allergies.    Review of Systems   Review of Systems  Constitutional:  Negative for appetite change and fever.  HENT:  Negative for congestion and rhinorrhea.   Respiratory:  Positive for shortness of breath.   Cardiovascular:  Positive for palpitations.  Gastrointestinal:  Negative for abdominal pain, nausea and vomiting.  Genitourinary:  Negative for dysuria and hematuria.  Musculoskeletal:  Negative for arthralgias.  Skin:  Negative for rash.  Neurological:  Negative for dizziness,  weakness and headaches.   all other systems are negative except as noted in the HPI and PMH.    Physical Exam Updated Vital Signs BP (!) 143/119 (BP Location: Left Arm)   Pulse (!) 175   Temp 98 F (36.7 C) (Oral)   Resp 20   SpO2 100%  Physical Exam Vitals and nursing note reviewed.  Constitutional:      General: She is not in acute distress.    Appearance: She is well-developed.     Comments: Anxious appearing  HENT:     Head: Normocephalic and atraumatic.     Mouth/Throat:     Pharynx: No oropharyngeal exudate.  Eyes:     Conjunctiva/sclera: Conjunctivae normal.     Pupils: Pupils are equal, round, and reactive to light.  Neck:     Comments: No meningismus. Cardiovascular:     Rate and Rhythm: Tachycardia present. Rhythm irregular.     Heart sounds: Normal heart sounds. No murmur heard. Pulmonary:     Effort: Pulmonary effort is normal. No respiratory distress.     Breath sounds: Normal breath sounds.  Chest:     Chest wall: No tenderness.  Abdominal:     Palpations: Abdomen is soft.     Tenderness: There is no abdominal tenderness. There is no guarding or rebound.  Musculoskeletal:        General: No tenderness. Normal range of motion.     Cervical back: Normal range  of motion and neck supple.  Skin:    General: Skin is warm.  Neurological:     General: No focal deficit present.     Mental Status: She is alert and oriented to person, place, and time. Mental status is at baseline.     Cranial Nerves: No cranial nerve deficit.     Motor: No abnormal muscle tone.     Coordination: Coordination normal.     Comments:  5/5 strength throughout. CN 2-12 intact.Equal grip strength.   Psychiatric:        Behavior: Behavior normal.     ED Results / Procedures / Treatments   Labs (all labs ordered are listed, but only abnormal results are displayed) Labs Reviewed  CBC WITH DIFFERENTIAL/PLATELET - Abnormal; Notable for the following components:      Result Value    RBC 6.72 (*)    Hemoglobin 18.1 (*)    HCT 56.6 (*)    RDW 17.0 (*)    Platelets 411 (*)    All other components within normal limits  COMPREHENSIVE METABOLIC PANEL - Abnormal; Notable for the following components:   CO2 21 (*)    Anion gap 16 (*)    All other components within normal limits  BRAIN NATRIURETIC PEPTIDE  TSH  D-DIMER, QUANTITATIVE  I-STAT BETA HCG BLOOD, ED ([facility], [facility], [facility] ONLY)  TROPONIN I (HIGH SENSITIVITY)  TROPONIN I (HIGH SENSITIVITY)    EKG EKG Interpretation  Date/Time:  Friday March 19 2022 07:49:35 EST Ventricular Rate:  142 PR Interval:    QRS Duration: 78 QT Interval:  286 QTC Calculation: 440 R Axis:   51 Text Interpretation: Atrial fibrillation Minimal ST depression, anterolateral leads No significant change was found Confirmed by Ezequiel Essex [phone]) on 03/19/2022 8:01:09 AM  Radiology DG Chest Portable 1 View  Result Date: 03/19/2022 CLINICAL DATA:  Tachycardia, palpitations. EXAM: PORTABLE CHEST 1 VIEW COMPARISON:  None Available. FINDINGS: The heart size and mediastinal contours are within normal limits. Low lung volumes with bibasilar atelectasis. No visible pleural effusion or pneumothorax. The visualized skeletal structures are unremarkable. IMPRESSION: Low lung volumes with bibasilar atelectasis. Electronically Signed   By: Dahlia Bailiff M.D.   On: 03/19/2022 08:17    Procedures Procedures    Medications Ordered in ED Medications  diltiazem (CARDIZEM) injection 10 mg (has no administration in time range)  metoprolol tartrate (LOPRESSOR) tablet 50 mg (has no administration in time range)    ED Course/ Medical Decision Making/ A&P         CHA2DS2-VASc Score: 1                    Medical Decision Making Amount and/or Complexity of Data Reviewed Labs: ordered. Decision-making details documented in ED Course. Radiology: ordered and independent interpretation performed. Decision-making details documented in ED Course. ECG/medicine  tests: ordered and independent interpretation performed. Decision-making details documented in ED Course.  Risk Prescription drug management.  Palpitations since last night with shortness of breath and chest tightness.  History of atrial fibrillation.  Blood pressure and mental status are stable.  Patient confident that palpitations onset at 10 PM last night.  Appears to be a good candidate for cardioversion.  Risk and benefits discussed with patient and she is anxious about cardioversion declines at this time.  Will attempt rhythm control with medications. Chads vasc score is 1.  After p.o. metoprolol and IV Cardizem, rate has improved to the 70s but remains in A-fib.  Patient is feeling  better.  Discussed with Dr. Shellia Carwin cardiology.  She agrees no need to have patient converted in the ED.  They recommends initiating long-acting Cardizem 240 mg with as needed short acting Cardizem.  Agrees does not need anticoagulation with Chads score of 1.  Remains in atrial fibrillation.  Palpitations have resolved.  No chest pain or shortness of breath.  Troponin negative x 2.  D-dimer negative.  Low suspicion for ACS or PE.  Will initiate cardizem per cardiology recommendations.  Follow-up with cardiology in A-fib clinic.  Risk and benefits of anticoagulation discussed with the patient and she agrees to defer at this time. She is also breast-feeding.  Discussed no known danger with Cardizem but no clear data.  Recommend pump and dump until seen by cardiology.        Final Clinical Impression(s) / ED Diagnoses Final diagnoses:  Paroxysmal atrial fibrillation [facility])    Rx / DC Orders ED Discharge Orders     None         Kyasia Steuck, Annie Main, MD 03/19/22 1104    Ezequiel Essex, MD 03/19/22 1104

## 2022-03-19 NOTE — Telephone Encounter (Signed)
Contacted patient to schedule ED follow up appointment to be seen at the [facility]. She is scheduled on February 14th @ 9:00am to see Clint Fenton-PA. Directions given to patient to access our clinic. Communicated with patient and she verbalized understanding.

## 2022-03-19 NOTE — ED Triage Notes (Signed)
Pt states last night she felt like her heart was racing. Pt states she has had Afib in the past but is not on any medications for it. No chest pain currently.

## 2022-03-24 ENCOUNTER — Ambulatory Visit (HOSPITAL_COMMUNITY): Payer: Medicaid Other | Admitting: Physician Assistant

## 2022-03-24 ENCOUNTER — Encounter (HOSPITAL_COMMUNITY): Payer: Self-pay

## 2022-03-24 NOTE — Progress Notes (Incomplete)
Primary Care Physician: Pcp, No Primary Cardiologist: none Primary Electrophysiologist: none Referring Physician: [facility]   Brandi Rhodes is a 32 y.o. female with a history of asthma and atrial fibrillation who presents for consultation in the [facility].  The patient was initially diagnosed with atrial fibrillation ***. She presented to the ED 03/19/22 with symptoms of tachypalpitations. She was found to be in rapid afib and was given IV metoprolol and diltiazem which slowed her heart rate to the 70s bpm. She was discharged in rate controlled afib. Patient was not started on anticoagulation for a CHADS2VASC score of 1. She was prescribed long-acting diltiazem.   Today, ***  Today, she denies symptoms of ***palpitations, chest pain, shortness of breath, orthopnea, PND, lower extremity edema, dizziness, presyncope, syncope, snoring, daytime somnolence, bleeding, or neurologic sequela. The patient is tolerating medications without difficulties and is otherwise without complaint today.    Atrial Fibrillation Risk Factors:  she {Action; does/does not:19097} have symptoms or diagnosis of sleep apnea. she {ACTION; IS/IS [id number] compliant with CPAP therapy. she {Action; does/does not:19097} have a history of rheumatic fever. she {Action; does/does not:19097} have a history of alcohol use. The patient {Action; does/does not:19097} have a history of early familial atrial fibrillation or other arrhythmias.  she has a BMI of There is no height or weight on file to calculate BMI.. There were no vitals filed for this visit.  Family History  Problem Relation Age of Onset   Diabetes Maternal Grandmother      Atrial Fibrillation Management history:  Previous antiarrhythmic drugs: none Previous cardioversions: none Previous ablations: none CHADS2VASC score: 1 Anticoagulation history: none   Past Medical History:  Diagnosis Date   Asthma    Past  Surgical History:  Procedure Laterality Date   BREAST LUMPECTOMY Right    DILATION AND CURETTAGE OF UTERUS     TONSILLECTOMY      Current Outpatient Medications  Medication Sig Dispense Refill   diltiazem (CARDIZEM CD) 120 MG 24 hr capsule Take 1 capsule (120 mg total) by mouth daily. 30 capsule 0   diltiazem (CARDIZEM) 30 MG tablet Take 1 tablet (30 mg total) by mouth every 8 (eight) hours as needed (As needed for palpitations). 30 tablet 0   ibuprofen (ADVIL) 600 MG tablet Take 1 tablet (600 mg total) by mouth every 6 (six) hours as needed (pain). (Patient not taking: Reported on 06/01/2021) 40 tablet 0   NIFEdipine (PROCARDIA XL) 30 MG 24 hr tablet Take 1 tablet (30 mg total) by mouth daily. (Patient not taking: Reported on 06/01/2021) 30 tablet 1   Prenatal Vit-Fe Fumarate-FA (PRENATAL PO) Take 1 tablet by mouth daily.     No current facility-administered medications for this visit.    No Known Allergies  Social History   Socioeconomic History   Marital status: Significant Other    Spouse name: Not on file   Number of children: Not on file   Years of education: Not on file   Highest education level: Not on file  Occupational History   Not on file  Tobacco Use   Smoking status: Former    Types: Cigarettes   Smokeless tobacco: Never  Vaping Use   Vaping Use: Former   Substances: Nicotine  Substance and Sexual Activity   Alcohol use: Not Currently   Drug use: Not Currently    Types: Marijuana   Sexual activity: Yes  Other Topics Concern   Not on file  Social History  Narrative   Not on file   Social Determinants of Health   Financial Resource Strain: Not on file  Food Insecurity: No Food Insecurity (04/09/2021)   Hunger Vital Sign    Worried About Running Out of Food in the Last Year: Never true    Ran Out of Food in the Last Year: Never true  Transportation Needs: No Transportation Needs (04/09/2021)   PRAPARE - Hydrologist (Medical): No     Lack of Transportation (Non-Medical): No  Physical Activity: Not on file  Stress: Not on file  Social Connections: Not on file  Intimate Partner Violence: Not on file     ROS- All systems are reviewed and negative except as per the HPI above.  Physical Exam: There were no vitals filed for this visit.  GEN- The patient is a well appearing ***{Desc; female/female:11659}, alert and oriented x 3 today.   Head- normocephalic, atraumatic Eyes-  Sclera clear, conjunctiva pink Ears- hearing intact Oropharynx- clear Neck- supple  Lungs- Clear to ausculation bilaterally, normal work of breathing Heart- ***Regular rate and rhythm, no murmurs, rubs or gallops  GI- soft, NT, ND, + BS Extremities- no clubbing, cyanosis, or edema MS- no significant deformity or atrophy Skin- no rash or lesion Psych- euthymic mood, full affect Neuro- strength and sensation are intact  Wt Readings from Last 3 Encounters:  06/01/21 104 kg  04/27/21 110.2 kg  04/19/21 114.2 kg    EKG today demonstrates  ***  Epic records are reviewed at length today  CHA2DS2-VASc Score =    The patient's score is based upon:   {Click here to calculate score.  REFRESH note before signing. :1}     ASSESSMENT AND PLAN: {Select the correct AFib Diagnosis                 :[id number]  ***dccv? Change dilt to propranolol, ablation? Warfarin vs Xarelto with pump and dump  3. Obesity There is no height or weight on file to calculate BMI. Lifestyle modification was discussed at length including regular exercise and weight reduction. ***  4.    Follow up ***   Adline Peals PA-C Afib [facility] [place], [place] [address] [phone] 03/24/2022 8:15 AM

## 2022-03-25 ENCOUNTER — Ambulatory Visit (HOSPITAL_COMMUNITY): Payer: Medicaid Other | Admitting: Nurse Practitioner

## 2022-05-12 ENCOUNTER — Institutional Professional Consult (permissible substitution): Payer: Medicaid Other | Admitting: Plastic Surgery

## 2024-02-08 IMAGING — US US FETAL BPP W/ NON-STRESS
1 series · 13 of 15 positions shown · non-contrast
Comparison: none

[Series 1: us fetal bpp w/ non-stress · 15 acquisitions, 13 frames shown]
[im 1/15]
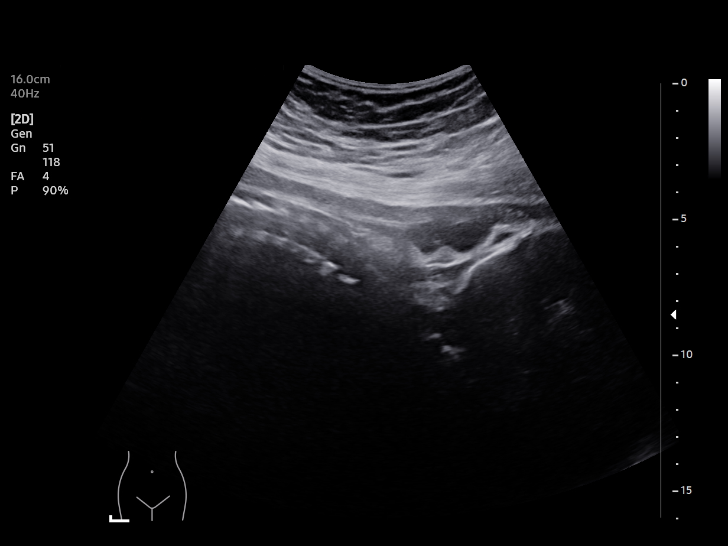
[im 2/15]
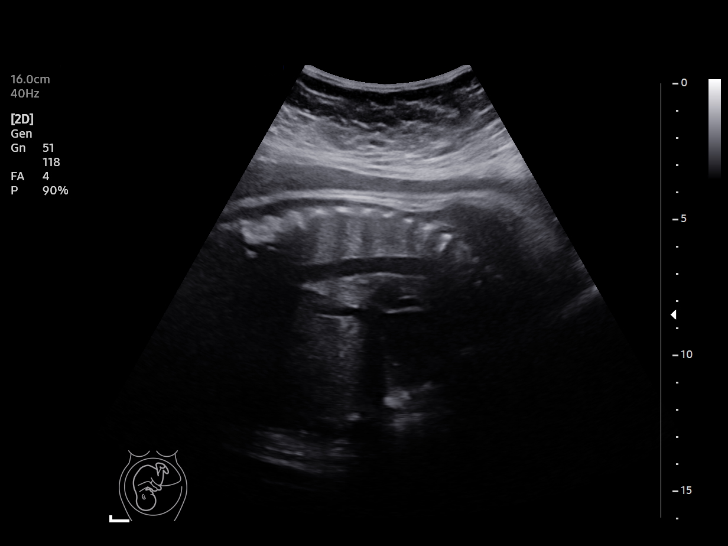
[im 3/15]
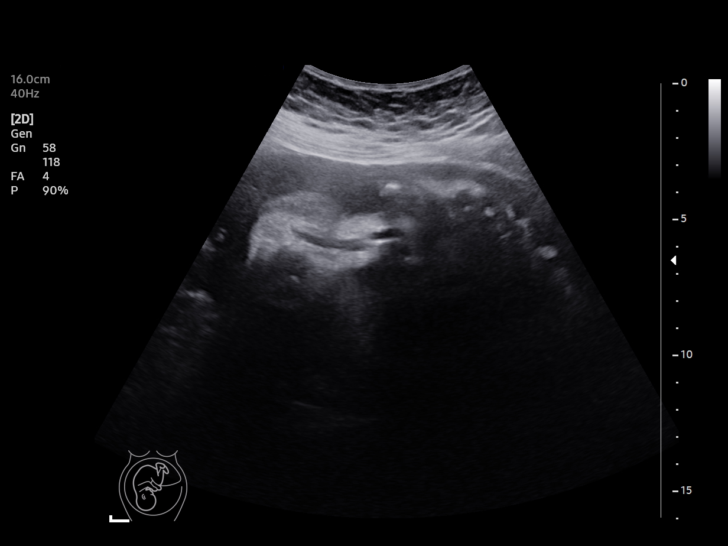
[im 5/15]
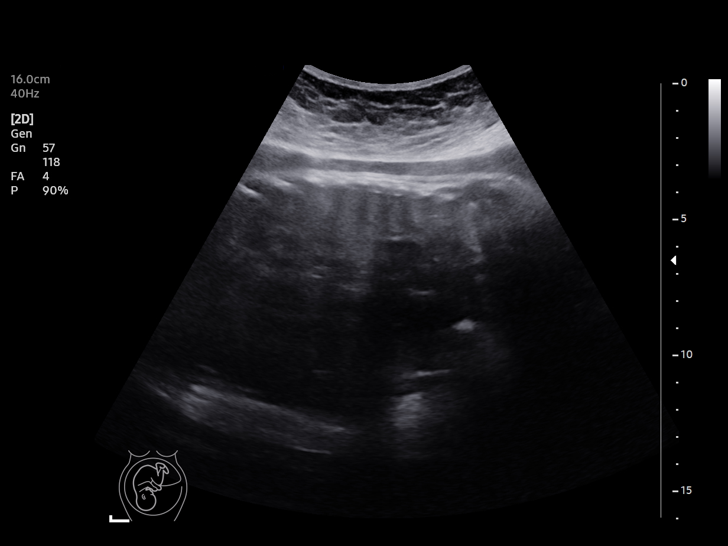
[im 6/15]
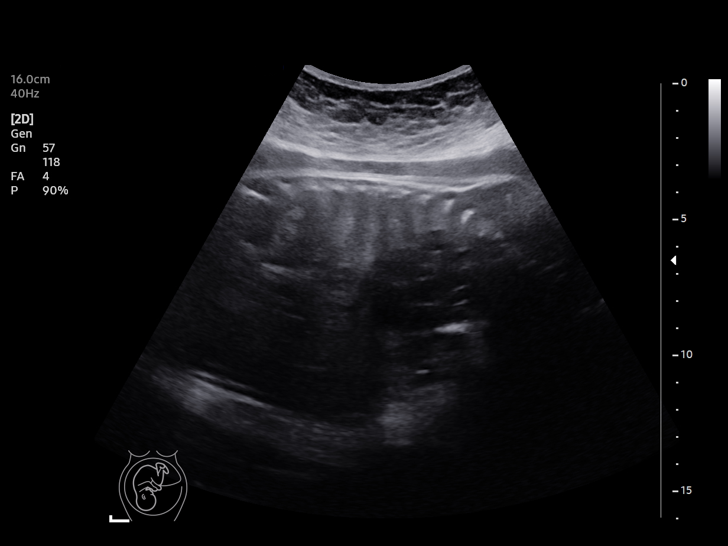
[im 7/15]
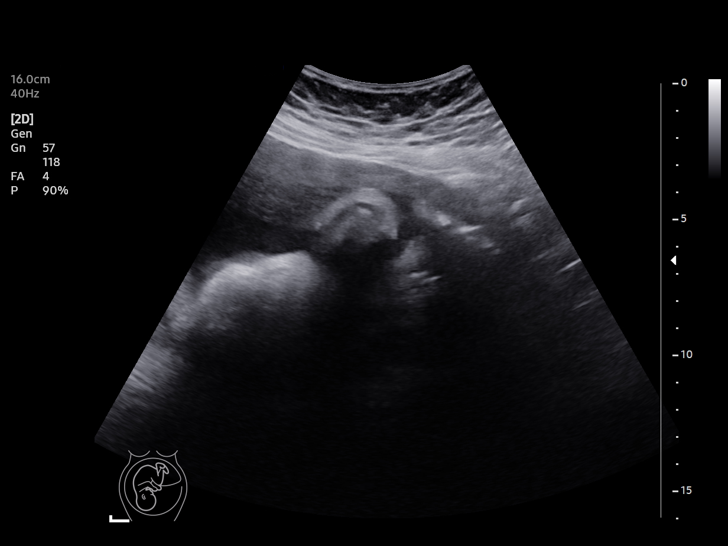
[im 8/15]
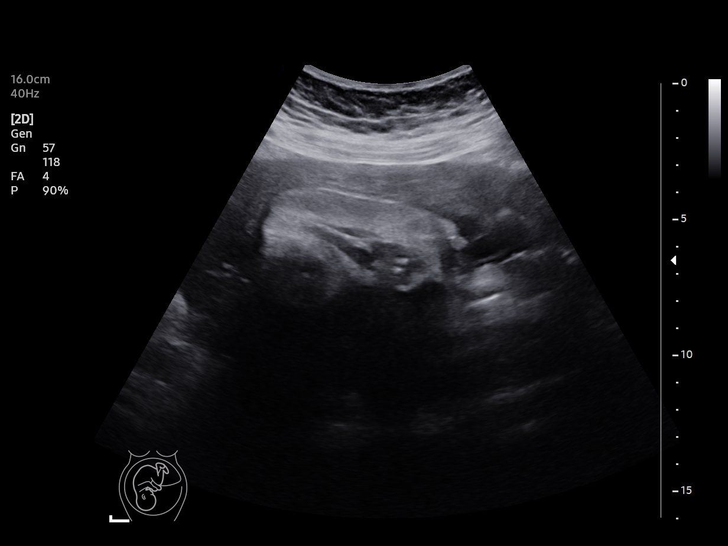
[im 9/15]
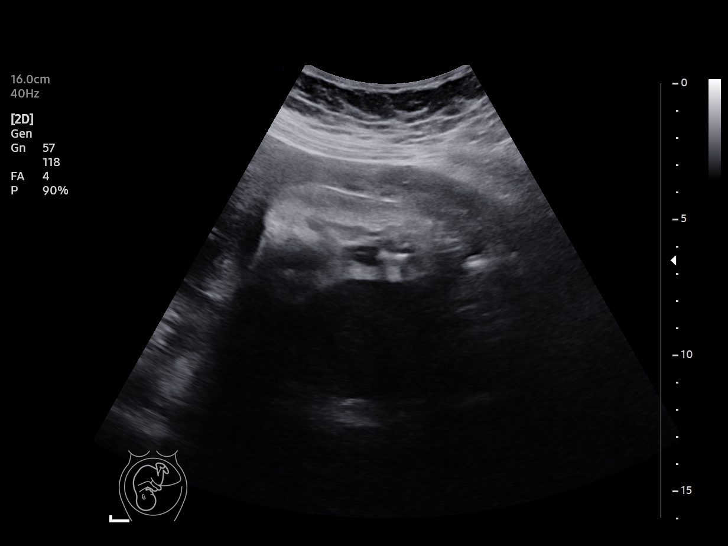
[im 10/15]
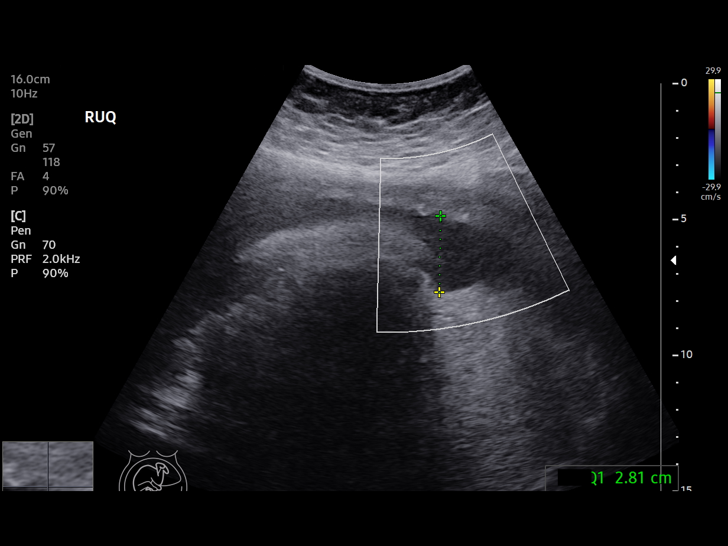
[im 11/15]
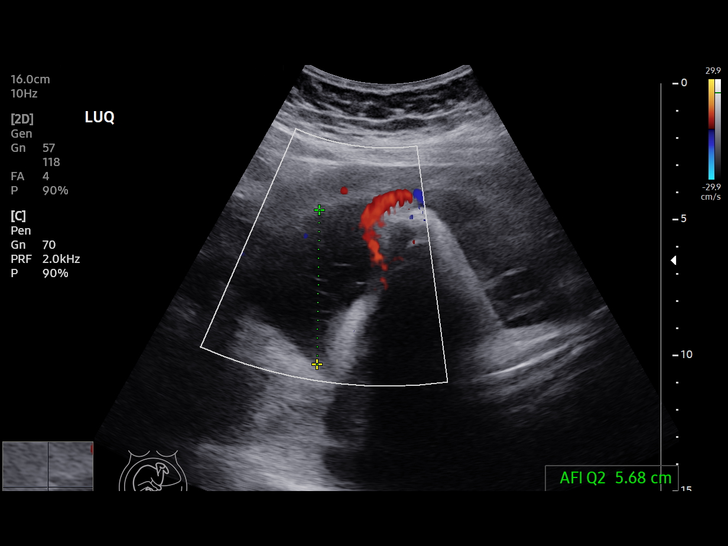
[im 13/15]
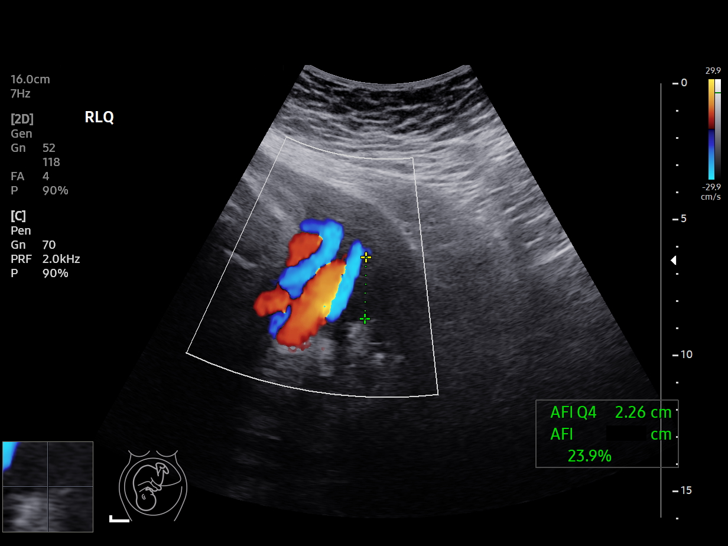
[im 14/15]
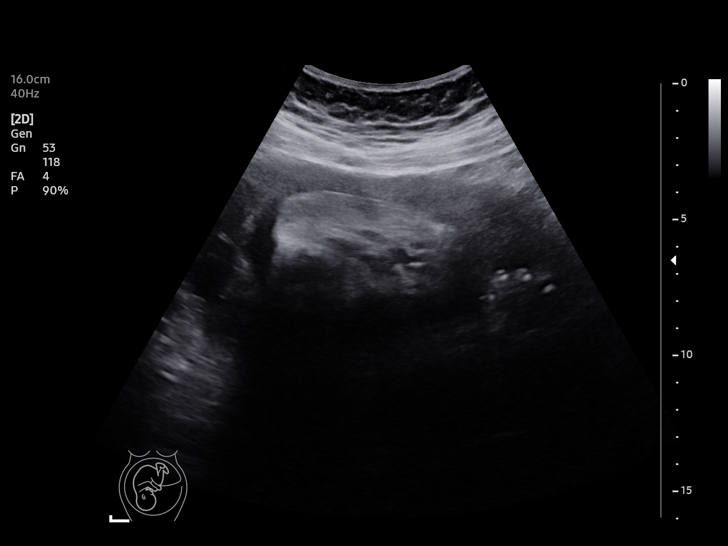
[im 15/15]
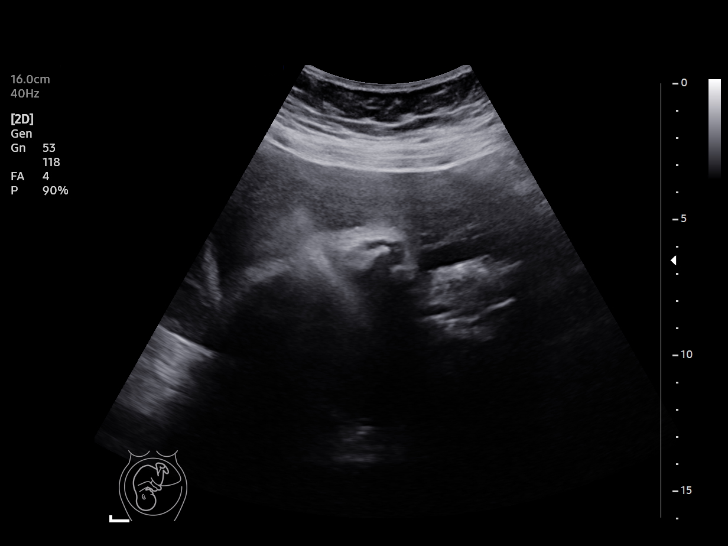

[13 of 15 positions shown; findings below may reference images not displayed]

Healthcare at
                   [REDACTED]care

Service(s) Provided

Indications

 37 weeks gestation of pregnancy
 Hypertension - Gestational
Fetal Evaluation

 Num Of Fetuses:         1
 Preg. Location:         Intrauterine
 Cardiac Activity:       Observed
 Presentation:           Cephalic

 Amniotic Fluid
 AFI FV:      Within normal limits

 AFI Sum(cm)     %Tile       Largest Pocket(cm)
 10.75           30

 RUQ(cm)       RLQ(cm)       LUQ(cm)        LLQ(cm)
 2.81          2.26          5.68           0
Biophysical Evaluation
 Amniotic F.V:   Pocket => 2 cm             F. Tone:        Observed
 F. Movement:    Observed                   N.S.T:          Reactive
 F. Breathing:   Observed                   Score:          [DATE]
OB History

 Gravidity:    5         Term:   2         SAB:   2
 Living:       2
Gestational Age

 LMP:           40w 1d        Date:  07/10/20                   EDD:   04/16/21
 Best:          37w 4d     Det. By:  U/S  (11/13/20)          EDD:   05/04/21
Impression

 Antenatal testing due to gestational hypertension.
 Testing is reassuring BPP [DATE].
Recommendations

 She is scheduled for IOL in two days.
              Mendung, Ismaun
